# Patient Record
Sex: Male | Born: 1940
Health system: Southern US, Community
[De-identification: ages and names within clinical notes are randomized; demographics above are authoritative.]

## PROBLEM LIST (undated history)

## (undated) DIAGNOSIS — M1990 Unspecified osteoarthritis, unspecified site: Secondary | ICD-10-CM

## (undated) DIAGNOSIS — E785 Hyperlipidemia, unspecified: Secondary | ICD-10-CM

## (undated) DIAGNOSIS — Z8679 Personal history of other diseases of the circulatory system: Secondary | ICD-10-CM

## (undated) DIAGNOSIS — Z87442 Personal history of urinary calculi: Secondary | ICD-10-CM

## (undated) DIAGNOSIS — R011 Cardiac murmur, unspecified: Secondary | ICD-10-CM

## (undated) DIAGNOSIS — I10 Essential (primary) hypertension: Secondary | ICD-10-CM

## (undated) DIAGNOSIS — K5790 Diverticulosis of intestine, part unspecified, without perforation or abscess without bleeding: Secondary | ICD-10-CM

## (undated) DIAGNOSIS — H269 Unspecified cataract: Secondary | ICD-10-CM

## (undated) DIAGNOSIS — D126 Benign neoplasm of colon, unspecified: Secondary | ICD-10-CM

## (undated) DIAGNOSIS — K219 Gastro-esophageal reflux disease without esophagitis: Secondary | ICD-10-CM

## (undated) HISTORY — PX: NECK SURGERY: SHX720

## (undated) HISTORY — DX: Diverticulosis of intestine, part unspecified, without perforation or abscess without bleeding: K57.90

## (undated) HISTORY — DX: Unspecified osteoarthritis, unspecified site: M19.90

## (undated) HISTORY — DX: Essential (primary) hypertension: I10

## (undated) HISTORY — PX: OTHER SURGICAL HISTORY: SHX169

## (undated) HISTORY — DX: Gastro-esophageal reflux disease without esophagitis: K21.9

## (undated) HISTORY — DX: Benign neoplasm of colon, unspecified: D12.6

## (undated) HISTORY — PX: COLONOSCOPY: SHX174

## (undated) HISTORY — DX: Hyperlipidemia, unspecified: E78.5

## (undated) HISTORY — DX: Unspecified cataract: H26.9

## (undated) HISTORY — PX: POLYPECTOMY: SHX149

## (undated) HISTORY — PX: EYE SURGERY: SHX253

---

## 1994-07-19 HISTORY — PX: ANKLE FRACTURE SURGERY: SHX122

## 1995-07-20 HISTORY — PX: CHOLECYSTECTOMY: SHX55

## 2005-12-01 ENCOUNTER — Ambulatory Visit: Payer: Self-pay | Admitting: Gastroenterology

## 2005-12-28 ENCOUNTER — Ambulatory Visit: Payer: Self-pay | Admitting: Gastroenterology

## 2005-12-28 ENCOUNTER — Encounter (INDEPENDENT_AMBULATORY_CARE_PROVIDER_SITE_OTHER): Payer: Self-pay | Admitting: Specialist

## 2009-07-19 HISTORY — PX: CATARACT EXTRACTION, BILATERAL: SHX1313

## 2010-01-22 ENCOUNTER — Ambulatory Visit (HOSPITAL_COMMUNITY): Admission: RE | Admit: 2010-01-22 | Discharge: 2010-01-22 | Payer: Self-pay | Admitting: Ophthalmology

## 2010-02-05 ENCOUNTER — Ambulatory Visit (HOSPITAL_COMMUNITY): Admission: RE | Admit: 2010-02-05 | Discharge: 2010-02-05 | Payer: Self-pay | Admitting: Ophthalmology

## 2010-03-05 ENCOUNTER — Ambulatory Visit (HOSPITAL_COMMUNITY): Admission: RE | Admit: 2010-03-05 | Discharge: 2010-03-05 | Payer: Self-pay | Admitting: Ophthalmology

## 2010-10-04 LAB — GLUCOSE, CAPILLARY: Glucose-Capillary: 134 mg/dL — ABNORMAL HIGH (ref 70–99)

## 2010-10-05 LAB — BASIC METABOLIC PANEL
BUN: 13 mg/dL (ref 6–23)
CO2: 26 mEq/L (ref 19–32)
Chloride: 103 mEq/L (ref 96–112)
Creatinine, Ser: 0.74 mg/dL (ref 0.4–1.5)

## 2011-03-01 ENCOUNTER — Encounter: Payer: Self-pay | Admitting: Gastroenterology

## 2011-04-14 ENCOUNTER — Ambulatory Visit (AMBULATORY_SURGERY_CENTER): Payer: Medicare Other | Admitting: *Deleted

## 2011-04-14 VITALS — Ht 68.0 in | Wt 214.0 lb

## 2011-04-14 DIAGNOSIS — Z1211 Encounter for screening for malignant neoplasm of colon: Secondary | ICD-10-CM

## 2011-04-14 MED ORDER — PEG-KCL-NACL-NASULF-NA ASC-C 100 G PO SOLR: ORAL | Status: DC

## 2011-04-28 ENCOUNTER — Other Ambulatory Visit: Payer: Self-pay | Admitting: Gastroenterology

## 2011-04-28 ENCOUNTER — Encounter: Payer: Self-pay | Admitting: Gastroenterology

## 2011-04-28 ENCOUNTER — Ambulatory Visit (AMBULATORY_SURGERY_CENTER): Payer: Medicare Other | Admitting: Gastroenterology

## 2011-04-28 VITALS — BP 125/84 | HR 72 | Temp 98.3°F | Resp 16 | Ht 68.0 in | Wt 214.0 lb

## 2011-04-28 DIAGNOSIS — K5730 Diverticulosis of large intestine without perforation or abscess without bleeding: Secondary | ICD-10-CM

## 2011-04-28 DIAGNOSIS — D126 Benign neoplasm of colon, unspecified: Secondary | ICD-10-CM

## 2011-04-28 DIAGNOSIS — Z86010 Personal history of colonic polyps: Secondary | ICD-10-CM

## 2011-04-28 DIAGNOSIS — Z800 Family history of malignant neoplasm of digestive organs: Secondary | ICD-10-CM

## 2011-04-28 DIAGNOSIS — Z1211 Encounter for screening for malignant neoplasm of colon: Secondary | ICD-10-CM

## 2011-04-28 DIAGNOSIS — D1330 Benign neoplasm of unspecified part of small intestine: Secondary | ICD-10-CM

## 2011-04-28 HISTORY — DX: Benign neoplasm of colon, unspecified: D12.6

## 2011-04-28 MED ORDER — SODIUM CHLORIDE 0.9 % IV SOLN: 500.0000 mL | INTRAVENOUS | Status: DC

## 2011-04-28 NOTE — Patient Instructions (Addendum)
Polyps, Colon  A polyp is extra tissue that grows inside your body. Colon polyps grow in the large intestine. The large intestine, also called the colon, is part of your digestive system. It is a long, hollow tube at the end of your digestive tract where your body makes and stores stool. Most polyps are not dangerous. They are benign. This means they are not cancerous. But over time, some types of polyps can turn into cancer. Polyps that are smaller than a pea are usually not harmful. But larger polyps could someday become or may already be cancerous. To be safe, doctors remove all polyps and test them.  WHO GETS POLYPS? Anyone can get polyps, but certain people are more likely than others. You may have a greater chance of getting polyps if:  You are over 50.   You have had polyps before.   Someone in your family has had polyps.   Someone in your family has had cancer of the large intestine.   Find out if someone in your family has had polyps. You may also be more likely to get polyps if you:   Eat a lot of fatty foods   Smoke   Drink alcohol   Do not exercise  Eat too much  SYMPTOMS Most small polyps do not cause symptoms. People often do not know they have one until their caregiver finds it during a regular checkup or while testing them for something else. Some people do have symptoms like these:  Bleeding from the anus. You might notice blood on your underwear or on toilet paper after you have had a bowel movement.   Constipation or diarrhea that lasts more than a week.   Blood in the stool. Blood can make stool look black or it can show up as red streaks in the stool.  If you have any of these symptoms, see your caregiver. HOW DOES THE DOCTOR TEST FOR POLYPS? The doctor can use four tests to check for polyps:  Digital rectal exam. The caregiver wears gloves and checks your rectum (the last part of the large intestine) to see if it feels normal. This test would find polyps only  in the rectum. Your caregiver may need to do one of the other tests listed below to find polyps higher up in the intestine.   Barium enema. The caregiver puts a liquid called barium into your rectum before taking x-rays of your large intestine. Barium makes your intestine look white in the pictures. Polyps are dark, so they are easy to see.   Sigmoidoscopy. With this test, the caregiver can see inside your large intestine. A thin flexible tube is placed into your rectum. The device is called a sigmoidoscope, which has a light and a tiny video camera in it. The caregiver uses the sigmoidoscope to look at the last third of your large intestine.   Colonoscopy. This test is like sigmoidoscopy, but the caregiver looks at all of the large intestine. It usually requires sedation. This is the most common method for finding and removing polyps.  TREATMENT  The caregiver will remove the polyp during sigmoidoscopy or colonoscopy. The polyp is then tested for cancer.   If you have had polyps, your caregiver may want you to get tested regularly in the future.  PREVENTION There is not one sure way to prevent polyps. You might be able to lower your risk of getting them if you:  Eat more fruits and vegetables and less fatty food.     Do not smoke.   Avoid alcohol.   Exercise every day.   Lose weight if you are overweight.   Eating more calcium and folate can also lower your risk of getting polyps. Some foods that are rich in calcium are milk, cheese, and broccoli. Some foods that are rich in folate are chickpeas, kidney beans, and spinach.   Aspirin might help prevent polyps. Studies are under way.  Document Released: 03/31/2004 Document Re-Released: 12/23/2009 [facility] Patient Information 2011 [place], [place]  .Diverticulosis Diverticulosis is a common condition that develops when small pouches (diverticula) form in the wall of the colon. The risk of diverticulosis increases with age. It happens more  often in people who eat a low-fiber diet. Most individuals with diverticulosis have no symptoms. Those individuals with symptoms usually experience belly (abdominal) pain, constipation, or loose stools (diarrhea). HOME CARE INSTRUCTIONS  Increase the amount of fiber in your diet as directed by your caregiver or dietician. This may reduce symptoms of diverticulosis.   Your caregiver may recommend taking a dietary fiber supplement.   Drink at least 6 to 8 glasses of water each day to prevent constipation.   Try not to strain when you have a bowel movement.   Your caregiver may recommend avoiding nuts and seeds to prevent complications, although this is still an uncertain benefit.   Only take over-the-counter or prescription medicines for pain, discomfort, or fever as directed by your caregiver.  FOODS HAVING HIGH FIBER CONTENT INCLUDE:  Fruits. Apple, peach, pear, tangerine, raisins, prunes.   Vegetables. Brussels sprouts, asparagus, broccoli, cabbage, carrot, cauliflower, romaine lettuce, spinach, summer squash, tomato, winter squash, zucchini.   Starchy Vegetables. Baked beans, kidney beans, lima beans, split peas, lentils, potatoes (with skin).   Grains. Whole wheat bread, brown rice, bran flake cereal, plain oatmeal, white rice, shredded wheat, bran muffins.  SEEK IMMEDIATE MEDICAL CARE IF:  You develop increasing pain or severe bloating.   You have an oral temperature above 100, not controlled by medicine.   You develop vomiting or bowel movements that are bloody or black.  Document Released: 04/01/2004 Document Re-Released: 12/23/2009 [facility] Patient Information 2011 [place], [place].   Please refer to your blue and neon green sheets for instructions regarding diet and activity for the rest of today.

## 2011-04-29 ENCOUNTER — Telehealth: Payer: Self-pay | Admitting: *Deleted

## 2011-04-29 NOTE — Telephone Encounter (Signed)

## 2011-05-12 ENCOUNTER — Encounter: Payer: Self-pay | Admitting: Gastroenterology

## 2012-03-03 ENCOUNTER — Other Ambulatory Visit: Payer: Self-pay | Admitting: Neurosurgery

## 2012-03-03 DIAGNOSIS — M5126 Other intervertebral disc displacement, lumbar region: Secondary | ICD-10-CM

## 2012-03-06 ENCOUNTER — Ambulatory Visit
Admission: RE | Admit: 2012-03-06 | Discharge: 2012-03-06 | Disposition: A | Discharge status: Home / Self Care | Payer: Medicare Other | Source: Ambulatory Visit | Attending: Neurosurgery | Admitting: Neurosurgery

## 2012-03-06 VITALS — BP 140/81 | HR 62

## 2012-03-06 DIAGNOSIS — M5126 Other intervertebral disc displacement, lumbar region: Secondary | ICD-10-CM

## 2012-03-06 MED ORDER — METHYLPREDNISOLONE ACETATE 40 MG/ML INJ SUSP (RADIOLOG
120.0000 mg | Freq: Once | INTRAMUSCULAR | Status: AC
  Administered 2012-03-06: 120 mg via EPIDURAL

## 2012-03-06 MED ORDER — IOHEXOL 180 MG/ML  SOLN
1.0000 mL | Freq: Once | INTRAMUSCULAR | Status: AC | PRN
  Administered 2012-03-06: 1 mL via EPIDURAL

## 2015-06-30 ENCOUNTER — Encounter: Payer: Self-pay | Admitting: Gastroenterology

## 2015-07-28 DIAGNOSIS — M19012 Primary osteoarthritis, left shoulder: Secondary | ICD-10-CM | POA: Diagnosis not present

## 2015-09-17 DIAGNOSIS — M25511 Pain in right shoulder: Secondary | ICD-10-CM | POA: Diagnosis not present

## 2015-09-17 DIAGNOSIS — E1165 Type 2 diabetes mellitus with hyperglycemia: Secondary | ICD-10-CM | POA: Diagnosis not present

## 2015-09-17 DIAGNOSIS — E7800 Pure hypercholesterolemia, unspecified: Secondary | ICD-10-CM | POA: Diagnosis not present

## 2015-09-17 DIAGNOSIS — Z6833 Body mass index (BMI) 33.0-33.9, adult: Secondary | ICD-10-CM | POA: Diagnosis not present

## 2015-09-17 DIAGNOSIS — M1990 Unspecified osteoarthritis, unspecified site: Secondary | ICD-10-CM | POA: Diagnosis not present

## 2015-11-10 DIAGNOSIS — H6123 Impacted cerumen, bilateral: Secondary | ICD-10-CM | POA: Diagnosis not present

## 2015-12-25 DIAGNOSIS — E1165 Type 2 diabetes mellitus with hyperglycemia: Secondary | ICD-10-CM | POA: Diagnosis not present

## 2015-12-25 DIAGNOSIS — Z299 Encounter for prophylactic measures, unspecified: Secondary | ICD-10-CM | POA: Diagnosis not present

## 2016-02-27 ENCOUNTER — Encounter: Payer: Self-pay | Admitting: Gastroenterology

## 2016-03-10 ENCOUNTER — Encounter: Payer: Self-pay | Admitting: Gastroenterology

## 2016-04-02 DIAGNOSIS — Z299 Encounter for prophylactic measures, unspecified: Secondary | ICD-10-CM | POA: Diagnosis not present

## 2016-04-02 DIAGNOSIS — E1165 Type 2 diabetes mellitus with hyperglycemia: Secondary | ICD-10-CM | POA: Diagnosis not present

## 2016-04-02 DIAGNOSIS — E7800 Pure hypercholesterolemia, unspecified: Secondary | ICD-10-CM | POA: Diagnosis not present

## 2016-04-02 DIAGNOSIS — I10 Essential (primary) hypertension: Secondary | ICD-10-CM | POA: Diagnosis not present

## 2016-04-02 DIAGNOSIS — M25511 Pain in right shoulder: Secondary | ICD-10-CM | POA: Diagnosis not present

## 2016-05-03 DIAGNOSIS — Z23 Encounter for immunization: Secondary | ICD-10-CM | POA: Diagnosis not present

## 2016-05-04 ENCOUNTER — Encounter: Payer: Self-pay | Admitting: Gastroenterology

## 2016-06-23 ENCOUNTER — Ambulatory Visit (AMBULATORY_SURGERY_CENTER): Payer: Self-pay | Admitting: *Deleted

## 2016-06-23 VITALS — Ht 68.0 in | Wt 221.0 lb

## 2016-06-23 DIAGNOSIS — Z800 Family history of malignant neoplasm of digestive organs: Secondary | ICD-10-CM

## 2016-06-23 DIAGNOSIS — Z86010 Personal history of colonic polyps: Secondary | ICD-10-CM

## 2016-06-23 MED ORDER — NA SULFATE-K SULFATE-MG SULF 17.5-3.13-1.6 GM/177ML PO SOLN: 1.0000 | Freq: Once | ORAL | 0 refills | Status: AC

## 2016-06-23 NOTE — Progress Notes (Signed)
No egg or soy allergy known to patient  No issues with past sedation with any surgeries  or procedures, no intubation problems  No diet pills per patient No home 02 use per patient  No blood thinners per patient  Pt denies issues with constipation  No A fib or A flutter  emmi video declined   

## 2016-06-28 DIAGNOSIS — Z7189 Other specified counseling: Secondary | ICD-10-CM | POA: Diagnosis not present

## 2016-06-28 DIAGNOSIS — Z299 Encounter for prophylactic measures, unspecified: Secondary | ICD-10-CM | POA: Diagnosis not present

## 2016-06-28 DIAGNOSIS — Z1389 Encounter for screening for other disorder: Secondary | ICD-10-CM | POA: Diagnosis not present

## 2016-06-28 DIAGNOSIS — Z1211 Encounter for screening for malignant neoplasm of colon: Secondary | ICD-10-CM | POA: Diagnosis not present

## 2016-06-28 DIAGNOSIS — Z0000 Encounter for general adult medical examination without abnormal findings: Secondary | ICD-10-CM | POA: Diagnosis not present

## 2016-06-28 DIAGNOSIS — E7800 Pure hypercholesterolemia, unspecified: Secondary | ICD-10-CM | POA: Diagnosis not present

## 2016-06-28 DIAGNOSIS — Z79899 Other long term (current) drug therapy: Secondary | ICD-10-CM | POA: Diagnosis not present

## 2016-06-28 DIAGNOSIS — R5383 Other fatigue: Secondary | ICD-10-CM | POA: Diagnosis not present

## 2016-06-28 DIAGNOSIS — Z125 Encounter for screening for malignant neoplasm of prostate: Secondary | ICD-10-CM | POA: Diagnosis not present

## 2016-06-28 DIAGNOSIS — I10 Essential (primary) hypertension: Secondary | ICD-10-CM | POA: Diagnosis not present

## 2016-07-05 ENCOUNTER — Encounter: Payer: Self-pay | Admitting: Gastroenterology

## 2016-07-05 ENCOUNTER — Ambulatory Visit (AMBULATORY_SURGERY_CENTER): Payer: Medicare Other | Admitting: Gastroenterology

## 2016-07-05 VITALS — BP 98/74 | HR 63 | Temp 98.0°F | Resp 13 | Ht 68.0 in | Wt 221.0 lb

## 2016-07-05 DIAGNOSIS — Z800 Family history of malignant neoplasm of digestive organs: Secondary | ICD-10-CM

## 2016-07-05 DIAGNOSIS — Z86010 Personal history of colonic polyps: Secondary | ICD-10-CM | POA: Diagnosis not present

## 2016-07-05 DIAGNOSIS — I10 Essential (primary) hypertension: Secondary | ICD-10-CM | POA: Diagnosis not present

## 2016-07-05 DIAGNOSIS — E119 Type 2 diabetes mellitus without complications: Secondary | ICD-10-CM | POA: Diagnosis not present

## 2016-07-05 MED ORDER — SODIUM CHLORIDE 0.9 % IV SOLN: 500.0000 mL | INTRAVENOUS | Status: DC

## 2016-07-05 NOTE — Op Note (Signed)
[place] Patient Name: Julian Dominguez Procedure Date: 07/05/2016 8:07 AM MRN: [id number] Endoscopist: Mallie Mussel L. Loletha Carrow , MD Age: 75 Referring MD:  Date of Birth: 1940-10-22 Gender: Male Account #: [id number] Procedure:                Colonoscopy Indications:              Surveillance: Personal history of adenomatous                            polyps on last colonoscopy 5 years ago (46mm TA                            10/12) Medicines:                Monitored Anesthesia Care Procedure:                Pre-Anesthesia Assessment:                           - Prior to the procedure, a History and Physical                            was performed, and patient medications and                            allergies were reviewed. The patient's tolerance of                            previous anesthesia was also reviewed. The risks                            and benefits of the procedure and the sedation                            options and risks were discussed with the patient.                            All questions were answered, and informed consent                            was obtained. Anticoagulants: The patient has taken                            aspirin. It was decided not to withhold this                            medication prior to the procedure. ASA Grade                            Assessment: II - A patient with mild systemic                            disease. After reviewing the risks and benefits,  the patient was deemed in satisfactory condition to                            undergo the procedure.                           After obtaining informed consent, the colonoscope                            was passed under direct vision. Throughout the                            procedure, the patient's blood pressure, pulse, and                            oxygen saturations were monitored continuously. The                            Model  CF-HQ190L [id number]) scope was introduced                            through the anus and advanced to the the cecum,                            identified by appendiceal orifice and ileocecal                            valve. The colonoscopy was performed without                            difficulty. The patient tolerated the procedure                            well. The quality of the bowel preparation was                            good. The ileocecal valve, appendiceal orifice, and                            rectum were photographed. The quality of the bowel                            preparation was evaluated using the BBPS [facility]                            Bowel Preparation Scale) with scores of: Right                            Colon = 2, Transverse Colon = 2 and Left Colon = 2.                            The total BBPS score equals 6. The bowel  preparation used was SUPREP. Scope In: 8:11:23 AM Scope Out: 8:28:25 AM Scope Withdrawal Time: 0 hours 8 minutes 48 seconds  Total Procedure Duration: 0 hours 17 minutes 2 seconds  Findings:                 The perianal and digital rectal examinations were                            normal.                           Multiple medium-mouthed diverticula were found in                            the left colon.                           The left colon was redundant.                           Internal hemorrhoids were found during                            retroflexion. The hemorrhoids were medium-sized and                            Grade I (internal hemorrhoids that do not prolapse).                           The exam was otherwise without abnormality on                            direct and retroflexion views. Complications:            No immediate complications. Estimated Blood Loss:     Estimated blood loss: none. Impression:               - Diverticulosis in the left colon.                           -  Internal hemorrhoids.                           - The examination was otherwise normal on direct                            and retroflexion views.                           - No specimens collected. Recommendation:           - Patient has a contact number available for                            emergencies. The signs and symptoms of potential                            delayed complications were discussed with the  patient. Return to normal activities tomorrow.                            Written discharge instructions were provided to the                            patient.                           - Resume previous diet.                           - Continue present medications.                           - Repeat colonoscopy in 5 years for screening                            purposes due to [place], AGE 17. Miette Molenda L. Loletha Carrow, MD 07/05/2016 8:32:30 AM This report has been signed electronically.

## 2016-07-05 NOTE — Progress Notes (Signed)
A/ox3, pleased with MAC, report to RN 

## 2016-07-05 NOTE — Patient Instructions (Signed)
  Handouts given: Diverticulosis and Hemorrhoids.   YOU HAD AN ENDOSCOPIC PROCEDURE TODAY AT THE [facility]:   Refer to the procedure report that was given to you for any specific questions about what was found during the examination.  If the procedure report does not answer your questions, please call your gastroenterologist to clarify.  If you requested that your care partner not be given the details of your procedure findings, then the procedure report has been included in a sealed envelope for you to review at your convenience later.  YOU SHOULD EXPECT: Some feelings of bloating in the abdomen. Passage of more gas than usual.  Walking can help get rid of the air that was put into your GI tract during the procedure and reduce the bloating. If you had a lower endoscopy (such as a colonoscopy or flexible sigmoidoscopy) you may notice spotting of blood in your stool or on the toilet paper. If you underwent a bowel prep for your procedure, you may not have a normal bowel movement for a few days.  Please Note:  You might notice some irritation and congestion in your nose or some drainage.  This is from the oxygen used during your procedure.  There is no need for concern and it should clear up in a day or so.  SYMPTOMS TO REPORT IMMEDIATELY:   Following lower endoscopy (colonoscopy or flexible sigmoidoscopy):  Excessive amounts of blood in the stool  Significant tenderness or worsening of abdominal pains  Swelling of the abdomen that is new, acute  Fever of 100F or higher    For urgent or emergent issues, a gastroenterologist can be reached at any hour by calling [phone].   DIET:  We do recommend a small meal at first, but then you may proceed to your regular diet.  Drink plenty of fluids but you should avoid alcoholic beverages for 24 hours.  ACTIVITY:  You should plan to take it easy for the rest of today and you should NOT DRIVE or use heavy machinery until tomorrow  (because of the sedation medicines used during the test).    FOLLOW UP: Our staff will call the number listed on your records the next business day following your procedure to check on you and address any questions or concerns that you may have regarding the information given to you following your procedure. If we do not reach you, we will leave a message.  However, if you are feeling well and you are not experiencing any problems, there is no need to return our call.  We will assume that you have returned to your regular daily activities without incident.  If any biopsies were taken you will be contacted by phone or by letter within the next 1-3 weeks.  Please call us at [phone] if you have not heard about the biopsies in 3 weeks.    SIGNATURES/CONFIDENTIALITY: You and/or your care partner have signed paperwork which will be entered into your electronic medical record.  These signatures attest to the fact that that the information above on your After Visit Summary has been reviewed and is understood.  Full responsibility of the confidentiality of this discharge information lies with you and/or your care-partner. 

## 2016-07-05 NOTE — Progress Notes (Signed)
Patient sitting on the side of the bed, urinated 400 ml.

## 2016-07-06 ENCOUNTER — Telehealth: Payer: Self-pay

## 2016-07-06 NOTE — Telephone Encounter (Signed)
  Follow up Call-  Call back number 07/05/2016  Post procedure Call Back phone  # [phone]  Permission to leave phone message Yes  Some recent data might be hidden     Patient questions:  Do you have a fever, pain , or abdominal swelling? No. Pain Score  0 *  Have you tolerated food without any problems? Yes.    Have you been able to return to your normal activities? Yes.    Do you have any questions about your discharge instructions: Diet   No. Medications  No. Follow up visit  No.  Do you have questions or concerns about your Care? No.  Actions: * If pain score is 4 or above: No action needed, pain <4.   Pt was asleep. I spoke with his wife.  No problems noted. maw

## 2016-07-07 DIAGNOSIS — M159 Polyosteoarthritis, unspecified: Secondary | ICD-10-CM | POA: Diagnosis not present

## 2016-07-07 DIAGNOSIS — E119 Type 2 diabetes mellitus without complications: Secondary | ICD-10-CM | POA: Diagnosis not present

## 2016-07-07 DIAGNOSIS — E7800 Pure hypercholesterolemia, unspecified: Secondary | ICD-10-CM | POA: Diagnosis not present

## 2016-07-09 DIAGNOSIS — Z299 Encounter for prophylactic measures, unspecified: Secondary | ICD-10-CM | POA: Diagnosis not present

## 2016-07-09 DIAGNOSIS — E1165 Type 2 diabetes mellitus with hyperglycemia: Secondary | ICD-10-CM | POA: Diagnosis not present

## 2016-10-21 DIAGNOSIS — E1165 Type 2 diabetes mellitus with hyperglycemia: Secondary | ICD-10-CM | POA: Diagnosis not present

## 2016-10-21 DIAGNOSIS — Z6833 Body mass index (BMI) 33.0-33.9, adult: Secondary | ICD-10-CM | POA: Diagnosis not present

## 2016-10-21 DIAGNOSIS — N400 Enlarged prostate without lower urinary tract symptoms: Secondary | ICD-10-CM | POA: Diagnosis not present

## 2016-10-21 DIAGNOSIS — Z299 Encounter for prophylactic measures, unspecified: Secondary | ICD-10-CM | POA: Diagnosis not present

## 2016-10-21 DIAGNOSIS — I10 Essential (primary) hypertension: Secondary | ICD-10-CM | POA: Diagnosis not present

## 2016-10-21 DIAGNOSIS — E7800 Pure hypercholesterolemia, unspecified: Secondary | ICD-10-CM | POA: Diagnosis not present

## 2017-01-27 DIAGNOSIS — E1165 Type 2 diabetes mellitus with hyperglycemia: Secondary | ICD-10-CM | POA: Diagnosis not present

## 2017-01-27 DIAGNOSIS — Z6832 Body mass index (BMI) 32.0-32.9, adult: Secondary | ICD-10-CM | POA: Diagnosis not present

## 2017-01-27 DIAGNOSIS — M1990 Unspecified osteoarthritis, unspecified site: Secondary | ICD-10-CM | POA: Diagnosis not present

## 2017-01-27 DIAGNOSIS — Z299 Encounter for prophylactic measures, unspecified: Secondary | ICD-10-CM | POA: Diagnosis not present

## 2017-01-27 DIAGNOSIS — N400 Enlarged prostate without lower urinary tract symptoms: Secondary | ICD-10-CM | POA: Diagnosis not present

## 2017-01-27 DIAGNOSIS — I10 Essential (primary) hypertension: Secondary | ICD-10-CM | POA: Diagnosis not present

## 2017-04-26 DIAGNOSIS — Z23 Encounter for immunization: Secondary | ICD-10-CM | POA: Diagnosis not present

## 2017-05-06 DIAGNOSIS — Z6832 Body mass index (BMI) 32.0-32.9, adult: Secondary | ICD-10-CM | POA: Diagnosis not present

## 2017-05-06 DIAGNOSIS — Z299 Encounter for prophylactic measures, unspecified: Secondary | ICD-10-CM | POA: Diagnosis not present

## 2017-05-06 DIAGNOSIS — Z713 Dietary counseling and surveillance: Secondary | ICD-10-CM | POA: Diagnosis not present

## 2017-05-06 DIAGNOSIS — E1165 Type 2 diabetes mellitus with hyperglycemia: Secondary | ICD-10-CM | POA: Diagnosis not present

## 2017-05-06 DIAGNOSIS — I10 Essential (primary) hypertension: Secondary | ICD-10-CM | POA: Diagnosis not present

## 2017-06-23 DIAGNOSIS — E119 Type 2 diabetes mellitus without complications: Secondary | ICD-10-CM | POA: Diagnosis not present

## 2017-07-01 DIAGNOSIS — Z6832 Body mass index (BMI) 32.0-32.9, adult: Secondary | ICD-10-CM | POA: Diagnosis not present

## 2017-07-01 DIAGNOSIS — Z1331 Encounter for screening for depression: Secondary | ICD-10-CM | POA: Diagnosis not present

## 2017-07-01 DIAGNOSIS — Z7189 Other specified counseling: Secondary | ICD-10-CM | POA: Diagnosis not present

## 2017-07-01 DIAGNOSIS — R5383 Other fatigue: Secondary | ICD-10-CM | POA: Diagnosis not present

## 2017-07-01 DIAGNOSIS — Z0000 Encounter for general adult medical examination without abnormal findings: Secondary | ICD-10-CM | POA: Diagnosis not present

## 2017-07-01 DIAGNOSIS — I10 Essential (primary) hypertension: Secondary | ICD-10-CM | POA: Diagnosis not present

## 2017-07-01 DIAGNOSIS — Z87891 Personal history of nicotine dependence: Secondary | ICD-10-CM | POA: Diagnosis not present

## 2017-07-01 DIAGNOSIS — E7800 Pure hypercholesterolemia, unspecified: Secondary | ICD-10-CM | POA: Diagnosis not present

## 2017-07-01 DIAGNOSIS — Z125 Encounter for screening for malignant neoplasm of prostate: Secondary | ICD-10-CM | POA: Diagnosis not present

## 2017-07-01 DIAGNOSIS — Z79899 Other long term (current) drug therapy: Secondary | ICD-10-CM | POA: Diagnosis not present

## 2017-07-01 DIAGNOSIS — Z1339 Encounter for screening examination for other mental health and behavioral disorders: Secondary | ICD-10-CM | POA: Diagnosis not present

## 2017-07-01 DIAGNOSIS — Z299 Encounter for prophylactic measures, unspecified: Secondary | ICD-10-CM | POA: Diagnosis not present

## 2017-08-19 DIAGNOSIS — Z6832 Body mass index (BMI) 32.0-32.9, adult: Secondary | ICD-10-CM | POA: Diagnosis not present

## 2017-08-19 DIAGNOSIS — I10 Essential (primary) hypertension: Secondary | ICD-10-CM | POA: Diagnosis not present

## 2017-08-19 DIAGNOSIS — E1165 Type 2 diabetes mellitus with hyperglycemia: Secondary | ICD-10-CM | POA: Diagnosis not present

## 2017-08-19 DIAGNOSIS — Z713 Dietary counseling and surveillance: Secondary | ICD-10-CM | POA: Diagnosis not present

## 2017-08-19 DIAGNOSIS — Z299 Encounter for prophylactic measures, unspecified: Secondary | ICD-10-CM | POA: Diagnosis not present

## 2017-11-10 DIAGNOSIS — M47816 Spondylosis without myelopathy or radiculopathy, lumbar region: Secondary | ICD-10-CM | POA: Diagnosis not present

## 2017-11-10 DIAGNOSIS — M5442 Lumbago with sciatica, left side: Secondary | ICD-10-CM | POA: Diagnosis not present

## 2017-11-10 DIAGNOSIS — M9903 Segmental and somatic dysfunction of lumbar region: Secondary | ICD-10-CM | POA: Diagnosis not present

## 2017-11-11 DIAGNOSIS — M9903 Segmental and somatic dysfunction of lumbar region: Secondary | ICD-10-CM | POA: Diagnosis not present

## 2017-11-11 DIAGNOSIS — M47816 Spondylosis without myelopathy or radiculopathy, lumbar region: Secondary | ICD-10-CM | POA: Diagnosis not present

## 2017-11-11 DIAGNOSIS — M5442 Lumbago with sciatica, left side: Secondary | ICD-10-CM | POA: Diagnosis not present

## 2017-11-15 DIAGNOSIS — D1800 Hemangioma unspecified site: Secondary | ICD-10-CM | POA: Diagnosis not present

## 2017-11-15 DIAGNOSIS — M47816 Spondylosis without myelopathy or radiculopathy, lumbar region: Secondary | ICD-10-CM | POA: Diagnosis not present

## 2017-11-15 DIAGNOSIS — M9903 Segmental and somatic dysfunction of lumbar region: Secondary | ICD-10-CM | POA: Diagnosis not present

## 2017-11-15 DIAGNOSIS — L570 Actinic keratosis: Secondary | ICD-10-CM | POA: Diagnosis not present

## 2017-11-15 DIAGNOSIS — M5442 Lumbago with sciatica, left side: Secondary | ICD-10-CM | POA: Diagnosis not present

## 2017-11-15 DIAGNOSIS — D485 Neoplasm of uncertain behavior of skin: Secondary | ICD-10-CM | POA: Diagnosis not present

## 2017-11-15 DIAGNOSIS — L821 Other seborrheic keratosis: Secondary | ICD-10-CM | POA: Diagnosis not present

## 2017-11-18 DIAGNOSIS — M9903 Segmental and somatic dysfunction of lumbar region: Secondary | ICD-10-CM | POA: Diagnosis not present

## 2017-11-18 DIAGNOSIS — M5442 Lumbago with sciatica, left side: Secondary | ICD-10-CM | POA: Diagnosis not present

## 2017-11-18 DIAGNOSIS — M47816 Spondylosis without myelopathy or radiculopathy, lumbar region: Secondary | ICD-10-CM | POA: Diagnosis not present

## 2017-12-02 DIAGNOSIS — Z299 Encounter for prophylactic measures, unspecified: Secondary | ICD-10-CM | POA: Diagnosis not present

## 2017-12-02 DIAGNOSIS — E7800 Pure hypercholesterolemia, unspecified: Secondary | ICD-10-CM | POA: Diagnosis not present

## 2017-12-02 DIAGNOSIS — M545 Low back pain: Secondary | ICD-10-CM | POA: Diagnosis not present

## 2017-12-02 DIAGNOSIS — E1165 Type 2 diabetes mellitus with hyperglycemia: Secondary | ICD-10-CM | POA: Diagnosis not present

## 2017-12-02 DIAGNOSIS — I10 Essential (primary) hypertension: Secondary | ICD-10-CM | POA: Diagnosis not present

## 2017-12-02 DIAGNOSIS — Z6832 Body mass index (BMI) 32.0-32.9, adult: Secondary | ICD-10-CM | POA: Diagnosis not present

## 2018-03-13 DIAGNOSIS — I10 Essential (primary) hypertension: Secondary | ICD-10-CM | POA: Diagnosis not present

## 2018-03-13 DIAGNOSIS — Z6830 Body mass index (BMI) 30.0-30.9, adult: Secondary | ICD-10-CM | POA: Diagnosis not present

## 2018-03-13 DIAGNOSIS — E1165 Type 2 diabetes mellitus with hyperglycemia: Secondary | ICD-10-CM | POA: Diagnosis not present

## 2018-03-13 DIAGNOSIS — Z299 Encounter for prophylactic measures, unspecified: Secondary | ICD-10-CM | POA: Diagnosis not present

## 2018-03-13 DIAGNOSIS — B029 Zoster without complications: Secondary | ICD-10-CM | POA: Diagnosis not present

## 2018-04-25 DIAGNOSIS — Z23 Encounter for immunization: Secondary | ICD-10-CM | POA: Diagnosis not present

## 2018-06-16 DIAGNOSIS — E1165 Type 2 diabetes mellitus with hyperglycemia: Secondary | ICD-10-CM | POA: Diagnosis not present

## 2018-06-16 DIAGNOSIS — Z299 Encounter for prophylactic measures, unspecified: Secondary | ICD-10-CM | POA: Diagnosis not present

## 2018-06-16 DIAGNOSIS — I10 Essential (primary) hypertension: Secondary | ICD-10-CM | POA: Diagnosis not present

## 2018-06-16 DIAGNOSIS — Z6830 Body mass index (BMI) 30.0-30.9, adult: Secondary | ICD-10-CM | POA: Diagnosis not present

## 2018-07-06 DIAGNOSIS — I10 Essential (primary) hypertension: Secondary | ICD-10-CM | POA: Diagnosis not present

## 2018-07-06 DIAGNOSIS — Z125 Encounter for screening for malignant neoplasm of prostate: Secondary | ICD-10-CM | POA: Diagnosis not present

## 2018-07-06 DIAGNOSIS — R5383 Other fatigue: Secondary | ICD-10-CM | POA: Diagnosis not present

## 2018-07-06 DIAGNOSIS — Z299 Encounter for prophylactic measures, unspecified: Secondary | ICD-10-CM | POA: Diagnosis not present

## 2018-07-06 DIAGNOSIS — Z6830 Body mass index (BMI) 30.0-30.9, adult: Secondary | ICD-10-CM | POA: Diagnosis not present

## 2018-07-06 DIAGNOSIS — Z79899 Other long term (current) drug therapy: Secondary | ICD-10-CM | POA: Diagnosis not present

## 2018-07-06 DIAGNOSIS — E1165 Type 2 diabetes mellitus with hyperglycemia: Secondary | ICD-10-CM | POA: Diagnosis not present

## 2018-07-06 DIAGNOSIS — Z1211 Encounter for screening for malignant neoplasm of colon: Secondary | ICD-10-CM | POA: Diagnosis not present

## 2018-07-06 DIAGNOSIS — Z1331 Encounter for screening for depression: Secondary | ICD-10-CM | POA: Diagnosis not present

## 2018-07-06 DIAGNOSIS — Z0000 Encounter for general adult medical examination without abnormal findings: Secondary | ICD-10-CM | POA: Diagnosis not present

## 2018-07-06 DIAGNOSIS — Z1339 Encounter for screening examination for other mental health and behavioral disorders: Secondary | ICD-10-CM | POA: Diagnosis not present

## 2018-07-06 DIAGNOSIS — Z87891 Personal history of nicotine dependence: Secondary | ICD-10-CM | POA: Diagnosis not present

## 2018-07-06 DIAGNOSIS — Z7189 Other specified counseling: Secondary | ICD-10-CM | POA: Diagnosis not present

## 2018-07-06 DIAGNOSIS — E7800 Pure hypercholesterolemia, unspecified: Secondary | ICD-10-CM | POA: Diagnosis not present

## 2018-09-11 DIAGNOSIS — E7800 Pure hypercholesterolemia, unspecified: Secondary | ICD-10-CM | POA: Diagnosis not present

## 2018-09-11 DIAGNOSIS — Z299 Encounter for prophylactic measures, unspecified: Secondary | ICD-10-CM | POA: Diagnosis not present

## 2018-09-11 DIAGNOSIS — Z6830 Body mass index (BMI) 30.0-30.9, adult: Secondary | ICD-10-CM | POA: Diagnosis not present

## 2018-09-11 DIAGNOSIS — E1165 Type 2 diabetes mellitus with hyperglycemia: Secondary | ICD-10-CM | POA: Diagnosis not present

## 2018-09-11 DIAGNOSIS — I10 Essential (primary) hypertension: Secondary | ICD-10-CM | POA: Diagnosis not present

## 2018-09-11 DIAGNOSIS — M47816 Spondylosis without myelopathy or radiculopathy, lumbar region: Secondary | ICD-10-CM | POA: Diagnosis not present

## 2018-09-18 ENCOUNTER — Other Ambulatory Visit: Payer: Self-pay | Admitting: Internal Medicine

## 2018-09-18 DIAGNOSIS — M545 Low back pain, unspecified: Secondary | ICD-10-CM

## 2018-09-23 ENCOUNTER — Ambulatory Visit
Admission: RE | Admit: 2018-09-23 | Discharge: 2018-09-23 | Disposition: A | Discharge status: Home / Self Care | Payer: Medicare Other | Source: Ambulatory Visit | Attending: Internal Medicine | Admitting: Internal Medicine

## 2018-09-23 DIAGNOSIS — M5126 Other intervertebral disc displacement, lumbar region: Secondary | ICD-10-CM | POA: Diagnosis not present

## 2018-09-23 DIAGNOSIS — M545 Low back pain, unspecified: Secondary | ICD-10-CM

## 2018-09-23 DIAGNOSIS — M47816 Spondylosis without myelopathy or radiculopathy, lumbar region: Secondary | ICD-10-CM | POA: Diagnosis not present

## 2018-09-23 DIAGNOSIS — M48061 Spinal stenosis, lumbar region without neurogenic claudication: Secondary | ICD-10-CM | POA: Diagnosis not present

## 2018-11-03 DIAGNOSIS — M48062 Spinal stenosis, lumbar region with neurogenic claudication: Secondary | ICD-10-CM | POA: Diagnosis not present

## 2018-11-03 DIAGNOSIS — M4726 Other spondylosis with radiculopathy, lumbar region: Secondary | ICD-10-CM | POA: Diagnosis not present

## 2018-11-21 DIAGNOSIS — M5116 Intervertebral disc disorders with radiculopathy, lumbar region: Secondary | ICD-10-CM | POA: Diagnosis not present

## 2018-11-21 DIAGNOSIS — M5416 Radiculopathy, lumbar region: Secondary | ICD-10-CM | POA: Diagnosis not present

## 2019-01-05 DIAGNOSIS — M48062 Spinal stenosis, lumbar region with neurogenic claudication: Secondary | ICD-10-CM | POA: Diagnosis not present

## 2019-01-11 ENCOUNTER — Other Ambulatory Visit: Payer: Self-pay | Admitting: Neurological Surgery

## 2019-01-11 DIAGNOSIS — Z299 Encounter for prophylactic measures, unspecified: Secondary | ICD-10-CM | POA: Diagnosis not present

## 2019-01-11 DIAGNOSIS — Z01818 Encounter for other preprocedural examination: Secondary | ICD-10-CM | POA: Diagnosis not present

## 2019-01-11 DIAGNOSIS — E1165 Type 2 diabetes mellitus with hyperglycemia: Secondary | ICD-10-CM | POA: Diagnosis not present

## 2019-01-11 DIAGNOSIS — I10 Essential (primary) hypertension: Secondary | ICD-10-CM | POA: Diagnosis not present

## 2019-01-11 DIAGNOSIS — Z6830 Body mass index (BMI) 30.0-30.9, adult: Secondary | ICD-10-CM | POA: Diagnosis not present

## 2019-01-15 ENCOUNTER — Other Ambulatory Visit: Payer: Self-pay | Admitting: Neurological Surgery

## 2019-01-15 ENCOUNTER — Other Ambulatory Visit (HOSPITAL_COMMUNITY): Payer: Self-pay | Admitting: Neurological Surgery

## 2019-01-15 DIAGNOSIS — M48062 Spinal stenosis, lumbar region with neurogenic claudication: Secondary | ICD-10-CM

## 2019-02-02 NOTE — Pre-Procedure Instructions (Signed)
[facility] [address] [place]  [address] Phone: [phone] Fax: [phone]      Your procedure is scheduled on Tuesday July 28th.  Report to [facility] Main Entrance "A" at 5:30 A.M., and check in at the Admitting office.  Call this number if you have problems the morning of surgery:  [phone]  Call [phone] if you have any questions prior to your surgery date Monday-Friday 8am-4pm    Remember:  Do not eat or drink after midnight the night before your surgery     Take these medicines the morning of surgery with A SIP OF WATER  traMADol (ULTRAM)    HOW TO MANAGE [place]  Why is it important to control my blood sugar before and after surgery? . Improving blood sugar levels before and after surgery helps healing and can limit problems. . A way of improving blood sugar control is eating a healthy diet by: o  Eating less sugar and carbohydrates o  Increasing activity/exercise o  Talking with your doctor about reaching your blood sugar goals . High blood sugars (greater than 180 mg/dL) can raise your risk of infections and slow your recovery, so you will need to focus on controlling your diabetes during the weeks before surgery. . Make sure that the doctor who takes care of your diabetes knows about your planned surgery including the date and location.  How do I manage my blood sugar before surgery? . Check your blood sugar at least 4 times a day, starting 2 days before surgery, to make sure that the level is not too high or low. o Check your blood sugar the morning of your surgery when you wake up and every 2 hours until you get to the Short Stay unit. . If your blood sugar is less than 70 mg/dL, you will need to treat for low blood sugar: o Do not take insulin. o Treat a low blood sugar (less than 70 mg/dL) with  cup of clear juice (cranberry or apple), 4 glucose tablets, OR glucose  gel. Recheck blood sugar in 15 minutes after treatment (to make sure it is greater than 70 mg/dL). If your blood sugar is not greater than 70 mg/dL on recheck, call [phone] o  for further instructions. . Report your blood sugar to the short stay nurse when you get to Short Stay.  . If you are admitted to the hospital after surgery: o Your blood sugar will be checked by the staff and you will probably be given insulin after surgery (instead of oral diabetes medicines) to make sure you have good blood sugar levels. o The goal for blood sugar control after surgery is 80-180 mg/dL.     WHAT DO I DO ABOUT MY DIABETES MEDICATION?   Marland Kitchen Do not take oral diabetes medicines (pills): glimepiride (AMARYL) the morning of surgery.   7 days prior to surgery STOP taking any Aspirin (unless otherwise instructed by your surgeon), Aleve, Naproxen, Ibuprofen, Motrin, Advil, Goody's, BC's, all herbal medications, fish oil, and all vitamins.    The Morning of Surgery  Do not wear jewelry, make-up or nail polish.  Do not wear lotions, powders, or perfumes/colognes, or deodorant  Do not shave 48 hours prior to surgery.  Men may shave face and neck.  Do not bring valuables to the hospital.  [facility] is not responsible for any belongings or valuables.  If you are a  smoker, DO NOT Smoke 24 hours prior to surgery IF you wear a CPAP at night please bring your mask, tubing, and machine the morning of surgery   Remember that you must have someone to transport you home after your surgery, and remain with you for 24 hours if you are discharged the same day.   Contacts, glasses, hearing aids, dentures or bridgework may not be worn into surgery.    Leave your suitcase in the car.  After surgery it may be brought to your room.  For patients admitted to the hospital, discharge time will be determined by your treatment team.  Patients discharged the day of surgery will not be allowed to drive home.     Special instructions:   [place]- Preparing For Surgery  Before surgery, you can play an important role. Because skin is not sterile, your skin needs to be as free of germs as possible. You can reduce the number of germs on your skin by washing with CHG (chlorahexidine gluconate) Soap before surgery.  CHG is an antiseptic cleaner which kills germs and bonds with the skin to continue killing germs even after washing.    Oral Hygiene is also important to reduce your risk of infection.  Remember - BRUSH YOUR TEETH THE MORNING OF SURGERY WITH YOUR REGULAR TOOTHPASTE  Please do not use if you have an allergy to CHG or antibacterial soaps. If your skin becomes reddened/irritated stop using the CHG.  Do not shave (including legs and underarms) for at least 48 hours prior to first CHG shower. It is OK to shave your face.  Please follow these instructions carefully.   1. Shower the NIGHT BEFORE SURGERY and the MORNING OF SURGERY with CHG Soap.   2. If you chose to wash your hair, wash your hair first as usual with your normal shampoo.  3. After you shampoo, rinse your hair and body thoroughly to remove the shampoo.  4. Use CHG as you would any other liquid soap. You can apply CHG directly to the skin and wash gently with a scrungie or a clean washcloth.   5. Apply the CHG Soap to your body ONLY FROM THE NECK DOWN.  Do not use on open wounds or open sores. Avoid contact with your eyes, ears, mouth and genitals (private parts). Wash Face and genitals (private parts)  with your normal soap.   6. Wash thoroughly, paying special attention to the area where your surgery will be performed.  7. Thoroughly rinse your body with warm water from the neck down.  8. DO NOT shower/wash with your normal soap after using and rinsing off the CHG Soap.  9. Pat yourself dry with a CLEAN TOWEL.  10. Wear CLEAN PAJAMAS to bed the night before surgery, wear comfortable clothes the morning of  surgery  11. Place CLEAN SHEETS on your bed the night of your first shower and DO NOT SLEEP WITH PETS.    Day of Surgery:  Do not apply any deodorants/lotions. Please shower the morning of surgery with the CHG soap  Please wear clean clothes to the hospital/surgery center.   Remember to brush your teeth WITH YOUR REGULAR TOOTHPASTE.   Please read over the following fact sheets that you were given.

## 2019-02-05 ENCOUNTER — Encounter (HOSPITAL_COMMUNITY): Payer: Self-pay

## 2019-02-05 ENCOUNTER — Encounter (HOSPITAL_COMMUNITY)
Admission: RE | Admit: 2019-02-05 | Discharge: 2019-02-05 | Disposition: A | Discharge status: Home / Self Care | Payer: Medicare Other | Source: Ambulatory Visit | Attending: Neurological Surgery | Admitting: Neurological Surgery

## 2019-02-05 ENCOUNTER — Ambulatory Visit (HOSPITAL_COMMUNITY)
Admission: RE | Admit: 2019-02-05 | Discharge: 2019-02-05 | Disposition: A | Discharge status: Home / Self Care | Payer: Medicare Other | Source: Ambulatory Visit | Attending: Neurological Surgery | Admitting: Neurological Surgery

## 2019-02-05 ENCOUNTER — Other Ambulatory Visit: Payer: Self-pay

## 2019-02-05 DIAGNOSIS — Z7984 Long term (current) use of oral hypoglycemic drugs: Secondary | ICD-10-CM | POA: Diagnosis not present

## 2019-02-05 DIAGNOSIS — Z87891 Personal history of nicotine dependence: Secondary | ICD-10-CM | POA: Insufficient documentation

## 2019-02-05 DIAGNOSIS — M48061 Spinal stenosis, lumbar region without neurogenic claudication: Secondary | ICD-10-CM | POA: Diagnosis not present

## 2019-02-05 DIAGNOSIS — E785 Hyperlipidemia, unspecified: Secondary | ICD-10-CM | POA: Diagnosis not present

## 2019-02-05 DIAGNOSIS — Z79899 Other long term (current) drug therapy: Secondary | ICD-10-CM | POA: Diagnosis not present

## 2019-02-05 DIAGNOSIS — E119 Type 2 diabetes mellitus without complications: Secondary | ICD-10-CM | POA: Diagnosis not present

## 2019-02-05 DIAGNOSIS — M48062 Spinal stenosis, lumbar region with neurogenic claudication: Secondary | ICD-10-CM | POA: Diagnosis not present

## 2019-02-05 DIAGNOSIS — I10 Essential (primary) hypertension: Secondary | ICD-10-CM | POA: Diagnosis not present

## 2019-02-05 DIAGNOSIS — Z7982 Long term (current) use of aspirin: Secondary | ICD-10-CM | POA: Diagnosis not present

## 2019-02-05 HISTORY — DX: Personal history of urinary calculi: Z87.442

## 2019-02-05 HISTORY — DX: Cardiac murmur, unspecified: R01.1

## 2019-02-05 LAB — BASIC METABOLIC PANEL
Anion gap: 9 (ref 5–15)
BUN: 25 mg/dL — ABNORMAL HIGH (ref 8–23)
CO2: 23 mmol/L (ref 22–32)
Calcium: 10.2 mg/dL (ref 8.9–10.3)
Chloride: 107 mmol/L (ref 98–111)
Creatinine, Ser: 1.15 mg/dL (ref 0.61–1.24)
GFR calc Af Amer: >60 mL/min (ref >60)
GFR calc non Af Amer: >60 mL/min (ref >60)
Glucose, Bld: 108 mg/dL — ABNORMAL HIGH (ref 70–99)
Potassium: 4.2 mmol/L (ref 3.5–5.1)
Sodium: 139 mmol/L (ref 135–145)

## 2019-02-05 LAB — TYPE AND SCREEN
ABO/RH(D): O POS
Antibody Screen: NEGATIVE

## 2019-02-05 LAB — CBC
HCT: 39.4 % (ref 39.0–52.0)
Hemoglobin: 13.2 g/dL (ref 13.0–17.0)
MCH: 31.7 pg (ref 26.0–34.0)
MCHC: 33.5 g/dL (ref 30.0–36.0)
MCV: 94.5 fL (ref 80.0–100.0)
Platelets: 199 10*3/uL (ref 150–400)
RBC: 4.17 MIL/uL — ABNORMAL LOW (ref 4.22–5.81)
RDW: 12.7 % (ref 11.5–15.5)
WBC: 6.3 10*3/uL (ref 4.0–10.5)
nRBC: 0 % (ref 0.0–0.2)

## 2019-02-05 LAB — ABO/RH: ABO/RH(D): O POS

## 2019-02-05 LAB — SURGICAL PCR SCREEN
MRSA, PCR: NEGATIVE
Staphylococcus aureus: POSITIVE — AB

## 2019-02-05 LAB — GLUCOSE, CAPILLARY: Glucose-Capillary: 104 mg/dL — ABNORMAL HIGH (ref 70–99)

## 2019-02-05 NOTE — Progress Notes (Signed)
Mupirocin Ointment Rx called into [facility] in [place] for positive PCR of Staph. Pt notifed of result and need to pick up Rx. He voiced understanding.

## 2019-02-05 NOTE — Progress Notes (Signed)
PCP - Dr. Woody Seller Cardiologist - n/a  Chest x-ray - n/a EKG - today Stress Test - n/a ECHO - n/a Cardiac Cath - n/a  Sleep Study - n/a CPAP -   Fasting Blood Sugar - around 100 Checks Blood Sugar __1___ times a day A1C - 5.9 (2 weeks ago)  Blood Thinner Instructions: n/a Aspirin Instructions: Last dose 02/03/19  Anesthesia review: no  Patient denies shortness of breath, fever, cough and chest pain at PAT appointment   Patient verbalized understanding of instructions that were given to them at the PAT appointment. Patient was also instructed that they will need to review over the PAT instructions again at home before surgery.  Covid Test is scheduled for 02/09/19. Pt instructed on self quarantine after the Covid test. Pt voices understanding.   Coronavirus Screening  Have you experienced the following symptoms:  Cough NO Fever (>100.72F)  NO Runny nose NO Sore throat NO Difficulty breathing/shortness of breath  NO  Have you or a family member traveled in the last 14 days and where? NO   Patient reminded that hospital visitation restrictions are in effect and the importance of the restrictions.

## 2019-02-05 NOTE — Pre-Procedure Instructions (Signed)
Julian Dominguez  02/05/2019    Your procedure is scheduled on Tuesday, February 13, 2019 at 7:30 AM.    Report to [facility] Entrance "A" Admitting Office at 5:30 AM.   Call this number if you have problems the morning of surgery: [phone]   Questions prior to day of surgery, please call [phone] between 8 & 4 PM.   Remember:  Do not eat or drink after midnight Monday, 02/12/19.  Take these medicines the morning of surgery with A SIP OF WATER: Tramadol - if needed  Do not take Glimepiride (Amaryl) morning of surgery.  Stop Aspirin as instructed by your physician/surgeon. Stop NSAIDS (Voltaren (diclofenac), Ibuprofen, Aleve, etc), Multivitamins and Herbal medications 7 days prior to surgery. Do not use any other Aspirin products 7 days prior to surgery.   How to Manage Your Diabetes Before Surgery   Why is it important to control my blood sugar before and after surgery?   Improving blood sugar levels before and after surgery helps healing and can limit problems.  A way of improving blood sugar control is eating a healthy diet by:  - Eating less sugar and carbohydrates  - Increasing activity/exercise  - Talk with your doctor about reaching your blood sugar goals  High blood sugars (greater than 180 mg/dL) can raise your risk of infections and slow down your recovery so you will need to focus on controlling your diabetes during the weeks before surgery.  Make sure that the doctor who takes care of your diabetes knows about your planned surgery including the date and location.  How do I manage my blood sugars before surgery?   Check your blood sugar at least 4 times a day, 2 days before surgery to make sure that they are not too high or low.  Check your blood sugar the morning of your surgery when you wake up and every 2 hours until you get to the Short-Stay unit.  Treat a low blood sugar (less than 70 mg/dL) with 1/2 cup of clear juice (cranberry or apple),  4 glucose tablets, OR glucose gel.  Recheck blood sugar in 15 minutes after treatment (to make sure it is greater than 70 mg/dL).  If blood sugar is not greater than 70 mg/dL on re-check, call [phone] for further instructions.   Report your blood sugar to the Short-Stay nurse when you get to Short-Stay.  References:  [facility], 2007 "How to Manage your Diabetes Before and After Surgery".   Do not wear jewelry.  Do not wear lotions, powders, cologne or deodorant.  Men may shave face and neck.  Do not bring valuables to the hospital.  [facility] is not responsible for any belongings or valuables.  Contacts, dentures or bridgework may not be worn into surgery.  Leave your suitcase in the car.  After surgery it may be brought to your room.  For patients admitted to the hospital, discharge time will be determined by your treatment team.  [facility] - Preparing for Surgery  Before surgery, you can play an important role.  Because skin is not sterile, your skin needs to be as free of germs as possible.  You can reduce the number of germs on you skin by washing with CHG (chlorahexidine gluconate) soap before surgery.  CHG is an antiseptic cleaner which kills germs and bonds with the skin to continue killing germs even after washing.  Oral Hygiene is also important in reducing the risk of  infection.  Remember to brush your teeth with your regular toothpaste the morning of surgery.  Please DO NOT use if you have an allergy to CHG or antibacterial soaps.  If your skin becomes reddened/irritated stop using the CHG and inform your nurse when you arrive at Short Stay.  Do not shave (including legs and underarms) for at least 48 hours prior to the first CHG shower.  You may shave your face.  Please follow these instructions carefully:   1.  Shower with CHG Soap the night before surgery and the morning of Surgery.  2.  If you choose to wash your hair, wash your hair  first as usual with your normal shampoo.  3.  After you shampoo, rinse your hair and body thoroughly to remove the shampoo. 4.  Use CHG as you would any other liquid soap.  You can apply chg directly to the skin and wash gently with a      scrungie or washcloth.           5.  Apply the CHG Soap to your body ONLY FROM THE NECK DOWN.   Do not use on open wounds or open sores. Avoid contact with your eyes, ears, mouth and genitals (private parts).  Wash genitals (private parts) with your normal soap - do this first before using the CHG soap.  6.  Wash thoroughly, paying special attention to the area where your surgery will be performed.  7.  Thoroughly rinse your body with warm water from the neck down.  8.  DO NOT shower/wash with your normal soap after using and rinsing off the CHG Soap.  9.  Pat yourself dry with a clean towel.            10.  Wear clean pajamas.            11.  Place clean sheets on your bed the night of your first shower and do not sleep with pets.  Day of Surgery  Shower as above. Do not apply any lotions/deodorants the morning of surgery.   Please wear clean clothes to the hospital. Remember to brush your teeth with toothpaste.   Please read over the fact sheets that you were given.

## 2019-02-06 NOTE — Progress Notes (Addendum)
Anesthesia Chart Review:  Case: [id number] Date/Time: 02/13/19 0715   Procedures:      Left side Lumbar 2-3 Lumbar 3-4 Lumbar 4-5 Anterolateral decompression, posterior pedicle screw fixation (Left ) - Left side Lumbar 2-3 Lumbar 3-4 Lumbar 4-5 Anterolateral decompression, posterior pedicle screw fixation     LUMBAR PERCUTANEOUS PEDICLE SCREW 3 LEVEL (N/A )     APPLICATION OF ROBOTIC ASSISTANCE FOR SPINAL PROCEDURE (N/A )   Anesthesia type: General   Pre-op diagnosis: Lumbar stenosis with neurogenic claudication   Location: [facility] OR ROOM 20 / [facility]   Surgeon: Kristeen Miss, MD      DISCUSSION: Patient is a 78 year old male scheduled for the above procedure.   History includes former smoker (quit 1970), DM2, HTN, HLD, childhood murmur, neck surgery. BMI is consistent with obesity.  Last PCP office note, EKG (if available), echo (if available), A1c result requested from [facility]. Reportedly recent A1c was 5.9%. Dr. Woody Seller did sign a note of medical clearance for surgery classifying patient as "moderate risk" with permission to hold ASA for 10 days prior to surgery. Reported last ASA 02/03/19.   He denied SOB, chest pain, fever, cough at PAT RN visit.   Presurgical COVID-19 test is scheduled for 02/09/2019. Will follow-up once additional records received. (UPDATE 02/08/19 12:58 PM: PCP records received. No EKG or echo. He was seen by Dr. Woody Seller on 01/11/19 for preoperative clearance. He wrote, "Moderate risk. No cardiac symptoms, does have risk factors DM."  DM well controlled with A1c 5.9% at that time. Based on currently available information, I would anticipate that he can proceed as planned if no acute changes and COVID-19 test negative.)   VS: BP (!) 148/88   Pulse 97   Temp 36.5 C   Resp 20   Ht 5\' 7"  (1.702 m)   Wt 93 kg   SpO2 100%   BMI 32.12 kg/m     PROVIDERS: Glenda Chroman, MD is PCP [facility])   LABS: Labs reviewed: Acceptable for surgery. A1c was 5.9%  on 01/09/19.  (all labs ordered are listed, but only abnormal results are displayed)  Labs Reviewed  SURGICAL PCR SCREEN - Abnormal; Notable for the following components:      Result Value   Staphylococcus aureus POSITIVE (*)    All other components within normal limits  GLUCOSE, CAPILLARY - Abnormal; Notable for the following components:   Glucose-Capillary 104 (*)    All other components within normal limits  BASIC METABOLIC PANEL - Abnormal; Notable for the following components:   Glucose, Bld 108 (*)    BUN 25 (*)    All other components within normal limits  CBC - Abnormal; Notable for the following components:   RBC 4.17 (*)    All other components within normal limits  TYPE AND SCREEN     IMAGES: CT L-spine 02/05/19: IMPRESSION: 1. Severe spinal stenosis at L2-3 and L3-4 slightly worse than on the prior MRI scan. 2. Slight spinal stenosis at L4-5. 3. Severe right facet arthritis at L5-S1. 4. Aortic atherosclerosis. 5. Small stones in the left kidney. Aortic Atherosclerosis (ICD10-I70.0).   EKG: 02/05/19: Normal sinus rhythm Inferior infarct , age undetermined Abnormal ECG - Probable low R waves in aVF is new when compared to 01/14/10 tracing, other findings unchanged. No Q wave in II.     CV: N/A   Past Medical History:  Diagnosis Date  . Arthritis   . Cataract    removed  bilatterally  . Diabetes mellitus   . Diverticulosis    at 04-28-11 colon   . Heart murmur    as a child  . History of kidney stones   . Hyperlipidemia    takes red yeast rice   . Hypertension   . Tubular adenoma of colon 04/28/2011    Past Surgical History:  Procedure Laterality Date  . [place]   hardware  . CATARACT EXTRACTION, BILATERAL  2011  . CHOLECYSTECTOMY  1997  . COLONOSCOPY    . cyst removed from finger    . NECK SURGERY    . POLYPECTOMY      MEDICATIONS: . Ascorbic Acid (VITAMIN C) 1000 MG tablet  . aspirin 81 MG chewable tablet  . B  Complex-C (SUPER B COMPLEX PO)  . BLACK ELDERBERRY PO  . diclofenac (VOLTAREN) 75 MG EC tablet  . Garlic 3817 MG CAPS  . glimepiride (AMARYL) 2 MG tablet  . ibuprofen (ADVIL) 200 MG tablet  . losartan (COZAAR) 50 MG tablet  . Multiple Vitamin (MULTIVITAMIN) tablet  . traMADol (ULTRAM) 50 MG tablet  . vitamin E 400 UNIT capsule   . 0.9 %  sodium chloride infusion    Myra Gianotti, PA-C Surgical Short Stay/Anesthesiology [facility] Phone [phone] [facility] Phone [phone] 02/06/2019 4:18 PM

## 2019-02-07 ENCOUNTER — Other Ambulatory Visit (HOSPITAL_COMMUNITY): Payer: Medicare Other

## 2019-02-08 NOTE — Anesthesia Preprocedure Evaluation (Addendum)
Anesthesia Evaluation  Patient identified by MRN, date of birth, ID band Patient awake    Reviewed: Allergy & Precautions, NPO status , Patient's Chart, lab work & pertinent test results  History of Anesthesia Complications Negative for: history of anesthetic complications  Airway Mallampati: III  TM Distance: >3 FB Neck ROM: Full    Dental  (+) Dental Advisory Given   Pulmonary neg COPD, neg recent URI, former smoker,    breath sounds clear to auscultation       Cardiovascular hypertension, Pt. on medications  Rhythm:Regular     Neuro/Psych negative neurological ROS  negative psych ROS   GI/Hepatic negative GI ROS, Neg liver ROS,   Endo/Other  diabetes, Type 2  Renal/GU negative Renal ROS     Musculoskeletal  (+) Arthritis ,   Abdominal   Peds  Hematology negative hematology ROS (+)   Anesthesia Other Findings   Reproductive/Obstetrics                            Anesthesia Physical Anesthesia Plan  ASA: II  Anesthesia Plan: General   Post-op Pain Management:    Induction: Intravenous  PONV Risk Score and Plan: 2 and Ondansetron and Dexamethasone  Airway Management Planned: Oral ETT  Additional Equipment: None  Intra-op Plan:   Post-operative Plan: Extubation in OR  Informed Consent: I have reviewed the patients History and Physical, chart, labs and discussed the procedure including the risks, benefits and alternatives for the proposed anesthesia with the patient or authorized representative who has indicated his/her understanding and acceptance.     Dental advisory given  Plan Discussed with: CRNA and Surgeon  Anesthesia Plan Comments: (PAT note written by Myra Gianotti, PA-C. )       Anesthesia Quick Evaluation

## 2019-02-09 ENCOUNTER — Other Ambulatory Visit (HOSPITAL_COMMUNITY)
Admission: RE | Admit: 2019-02-09 | Discharge: 2019-02-09 | Disposition: A | Discharge status: Home / Self Care | Payer: Medicare Other | Source: Ambulatory Visit | Attending: Neurological Surgery | Admitting: Neurological Surgery

## 2019-02-09 ENCOUNTER — Other Ambulatory Visit: Payer: Self-pay

## 2019-02-09 DIAGNOSIS — Z1159 Encounter for screening for other viral diseases: Secondary | ICD-10-CM | POA: Diagnosis not present

## 2019-02-10 LAB — SARS CORONAVIRUS 2 (TAT 6-24 HRS): SARS Coronavirus 2: NEGATIVE

## 2019-02-13 ENCOUNTER — Inpatient Hospital Stay (HOSPITAL_COMMUNITY): Payer: Medicare Other | Admitting: Vascular Surgery

## 2019-02-13 ENCOUNTER — Inpatient Hospital Stay (HOSPITAL_COMMUNITY): Payer: Medicare Other | Admitting: Certified Registered Nurse Anesthetist

## 2019-02-13 ENCOUNTER — Encounter (HOSPITAL_COMMUNITY): Payer: Self-pay | Admitting: Critical Care Medicine

## 2019-02-13 ENCOUNTER — Inpatient Hospital Stay (HOSPITAL_COMMUNITY): Payer: Medicare Other

## 2019-02-13 ENCOUNTER — Inpatient Hospital Stay (HOSPITAL_COMMUNITY)
Admission: RE | Admit: 2019-02-13 | Discharge: 2019-02-14 | DRG: 458 | Disposition: A | Discharge status: Home / Self Care | Payer: Medicare Other | Attending: Neurological Surgery | Admitting: Neurological Surgery

## 2019-02-13 ENCOUNTER — Encounter (HOSPITAL_COMMUNITY)
Admission: RE | Disposition: A | Discharge status: Home / Self Care | Payer: Self-pay | Source: Home / Self Care | Attending: Neurological Surgery

## 2019-02-13 ENCOUNTER — Other Ambulatory Visit: Payer: Self-pay

## 2019-02-13 DIAGNOSIS — M4156 Other secondary scoliosis, lumbar region: Secondary | ICD-10-CM | POA: Diagnosis present

## 2019-02-13 DIAGNOSIS — M48062 Spinal stenosis, lumbar region with neurogenic claudication: Secondary | ICD-10-CM | POA: Diagnosis present

## 2019-02-13 DIAGNOSIS — M47816 Spondylosis without myelopathy or radiculopathy, lumbar region: Secondary | ICD-10-CM | POA: Diagnosis not present

## 2019-02-13 DIAGNOSIS — Z419 Encounter for procedure for purposes other than remedying health state, unspecified: Secondary | ICD-10-CM

## 2019-02-13 DIAGNOSIS — E119 Type 2 diabetes mellitus without complications: Secondary | ICD-10-CM | POA: Diagnosis present

## 2019-02-13 DIAGNOSIS — I10 Essential (primary) hypertension: Secondary | ICD-10-CM | POA: Diagnosis not present

## 2019-02-13 DIAGNOSIS — Z981 Arthrodesis status: Secondary | ICD-10-CM | POA: Diagnosis not present

## 2019-02-13 DIAGNOSIS — M4186 Other forms of scoliosis, lumbar region: Secondary | ICD-10-CM | POA: Diagnosis not present

## 2019-02-13 DIAGNOSIS — M5136 Other intervertebral disc degeneration, lumbar region: Secondary | ICD-10-CM | POA: Diagnosis not present

## 2019-02-13 HISTORY — PX: APPLICATION OF ROBOTIC ASSISTANCE FOR SPINAL PROCEDURE: SHX6753

## 2019-02-13 HISTORY — PX: LUMBAR PERCUTANEOUS PEDICLE SCREW 3 LEVEL: SHX5562

## 2019-02-13 HISTORY — PX: ANTERIOR LAT LUMBAR FUSION: SHX1168

## 2019-02-13 LAB — GLUCOSE, CAPILLARY
Glucose-Capillary: 129 mg/dL — ABNORMAL HIGH (ref 70–99)
Glucose-Capillary: 242 mg/dL — ABNORMAL HIGH (ref 70–99)
Glucose-Capillary: 245 mg/dL — ABNORMAL HIGH (ref 70–99)
Glucose-Capillary: 225 mg/dL — ABNORMAL HIGH (ref 70–99)
Glucose-Capillary: 173 mg/dL — ABNORMAL HIGH (ref 70–99)

## 2019-02-13 SURGERY — ANTERIOR LATERAL LUMBAR FUSION 3 LEVELS
Anesthesia: General

## 2019-02-13 MED ORDER — OXYCODONE HCL 5 MG PO TABS
5.0000 mg | ORAL_TABLET | Freq: Once | ORAL | Status: AC | PRN
  Administered 2019-02-13: 15:00:00 5 mg via ORAL

## 2019-02-13 MED ORDER — ONDANSETRON HCL 4 MG PO TABS: 4.0000 mg | ORAL_TABLET | Freq: Four times a day (QID) | ORAL | Status: DC | PRN

## 2019-02-13 MED ORDER — CHLORHEXIDINE GLUCONATE CLOTH 2 % EX PADS: 6.0000 | MEDICATED_PAD | Freq: Once | CUTANEOUS | Status: DC

## 2019-02-13 MED ORDER — MENTHOL 3 MG MT LOZG: 1.0000 | LOZENGE | OROMUCOSAL | Status: DC | PRN

## 2019-02-13 MED ORDER — ASPIRIN 81 MG PO CHEW
81.0000 mg | CHEWABLE_TABLET | Freq: Every day | ORAL | Status: DC
  Administered 2019-02-14: 08:00:00 81 mg via ORAL
  Filled 2019-02-13: qty 1

## 2019-02-13 MED ORDER — CEFAZOLIN SODIUM-DEXTROSE 2-4 GM/100ML-% IV SOLN
INTRAVENOUS | Status: AC
  Filled 2019-02-13: qty 100

## 2019-02-13 MED ORDER — MORPHINE SULFATE (PF) 2 MG/ML IV SOLN
2.0000 mg | INTRAVENOUS | Status: DC | PRN
  Administered 2019-02-13 (×2): 2 mg via INTRAVENOUS
  Filled 2019-02-13: qty 1

## 2019-02-13 MED ORDER — LIDOCAINE-EPINEPHRINE 1 %-1:100000 IJ SOLN
INTRAMUSCULAR | Status: AC
  Filled 2019-02-13: qty 1

## 2019-02-13 MED ORDER — GLIMEPIRIDE 2 MG PO TABS
2.0000 mg | ORAL_TABLET | Freq: Every day | ORAL | Status: DC
  Administered 2019-02-14: 08:00:00 2 mg via ORAL
  Filled 2019-02-13: qty 1

## 2019-02-13 MED ORDER — FENTANYL CITRATE (PF) 250 MCG/5ML IJ SOLN
INTRAMUSCULAR | Status: AC
  Filled 2019-02-13: qty 5

## 2019-02-13 MED ORDER — ACETAMINOPHEN 10 MG/ML IV SOLN
1000.0000 mg | Freq: Once | INTRAVENOUS | Status: DC | PRN
  Administered 2019-02-13: 14:00:00 1000 mg via INTRAVENOUS

## 2019-02-13 MED ORDER — PROPOFOL 10 MG/ML IV BOLUS
INTRAVENOUS | Status: DC | PRN
  Administered 2019-02-13: 140 mg via INTRAVENOUS

## 2019-02-13 MED ORDER — METHOCARBAMOL 500 MG PO TABS
ORAL_TABLET | ORAL | Status: AC
  Filled 2019-02-13: qty 1

## 2019-02-13 MED ORDER — KETOROLAC TROMETHAMINE 15 MG/ML IJ SOLN
INTRAMUSCULAR | Status: AC
  Filled 2019-02-13: qty 1

## 2019-02-13 MED ORDER — DEXAMETHASONE SODIUM PHOSPHATE 10 MG/ML IJ SOLN
INTRAMUSCULAR | Status: AC
  Filled 2019-02-13: qty 1

## 2019-02-13 MED ORDER — LIDOCAINE 2% (20 MG/ML) 5 ML SYRINGE
INTRAMUSCULAR | Status: AC
  Filled 2019-02-13: qty 5

## 2019-02-13 MED ORDER — SODIUM CHLORIDE 0.9% FLUSH: 3.0000 mL | INTRAVENOUS | Status: DC | PRN

## 2019-02-13 MED ORDER — ACETAMINOPHEN 500 MG PO TABS: 1000.0000 mg | ORAL_TABLET | Freq: Once | ORAL | Status: DC | PRN

## 2019-02-13 MED ORDER — DEXAMETHASONE SODIUM PHOSPHATE 10 MG/ML IJ SOLN
INTRAMUSCULAR | Status: DC | PRN
  Administered 2019-02-13: 4 mg via INTRAVENOUS

## 2019-02-13 MED ORDER — METHOCARBAMOL 1000 MG/10ML IJ SOLN
500.0000 mg | Freq: Four times a day (QID) | INTRAVENOUS | Status: DC | PRN
  Filled 2019-02-13: qty 5

## 2019-02-13 MED ORDER — LACTATED RINGERS IV SOLN
INTRAVENOUS | Status: DC | PRN
  Administered 2019-02-13: 07:00:00 via INTRAVENOUS

## 2019-02-13 MED ORDER — SODIUM CHLORIDE 0.9 % IV SOLN
INTRAVENOUS | Status: DC | PRN
  Administered 2019-02-13: 15 ug/min via INTRAVENOUS

## 2019-02-13 MED ORDER — CEFAZOLIN SODIUM-DEXTROSE 2-4 GM/100ML-% IV SOLN
2.0000 g | INTRAVENOUS | Status: AC
  Administered 2019-02-13: 2 g via INTRAVENOUS
  Administered 2019-02-13: 1 g via INTRAVENOUS

## 2019-02-13 MED ORDER — KETOROLAC TROMETHAMINE 15 MG/ML IJ SOLN
7.5000 mg | Freq: Four times a day (QID) | INTRAMUSCULAR | Status: AC
  Administered 2019-02-13 – 2019-02-14 (×4): 7.5 mg via INTRAVENOUS
  Filled 2019-02-13 (×3): qty 1

## 2019-02-13 MED ORDER — LACTATED RINGERS IV SOLN: INTRAVENOUS | Status: DC

## 2019-02-13 MED ORDER — SODIUM CHLORIDE 0.9% FLUSH
3.0000 mL | Freq: Two times a day (BID) | INTRAVENOUS | Status: DC
  Administered 2019-02-13: 23:00:00 10 mL via INTRAVENOUS

## 2019-02-13 MED ORDER — FENTANYL CITRATE (PF) 100 MCG/2ML IJ SOLN
INTRAMUSCULAR | Status: AC
  Filled 2019-02-13: qty 2

## 2019-02-13 MED ORDER — SUCCINYLCHOLINE CHLORIDE 200 MG/10ML IV SOSY
PREFILLED_SYRINGE | INTRAVENOUS | Status: DC | PRN
  Administered 2019-02-13: 80 mg via INTRAVENOUS

## 2019-02-13 MED ORDER — FENTANYL CITRATE (PF) 100 MCG/2ML IJ SOLN
25.0000 ug | INTRAMUSCULAR | Status: DC | PRN
  Administered 2019-02-13: 50 ug via INTRAVENOUS
  Administered 2019-02-13: 16:00:00 25 ug via INTRAVENOUS
  Administered 2019-02-13: 15:00:00 50 ug via INTRAVENOUS
  Administered 2019-02-13: 14:00:00 25 ug via INTRAVENOUS

## 2019-02-13 MED ORDER — CEFAZOLIN SODIUM-DEXTROSE 2-4 GM/100ML-% IV SOLN
2.0000 g | Freq: Three times a day (TID) | INTRAVENOUS | Status: AC
  Administered 2019-02-13 – 2019-02-14 (×2): 2 g via INTRAVENOUS
  Filled 2019-02-13: qty 100

## 2019-02-13 MED ORDER — DOCUSATE SODIUM 100 MG PO CAPS
100.0000 mg | ORAL_CAPSULE | Freq: Two times a day (BID) | ORAL | Status: DC
  Administered 2019-02-13 – 2019-02-14 (×2): 100 mg via ORAL
  Filled 2019-02-13 (×2): qty 1

## 2019-02-13 MED ORDER — PROPOFOL 500 MG/50ML IV EMUL
INTRAVENOUS | Status: DC | PRN
  Administered 2019-02-13: 50 ug/kg/min via INTRAVENOUS
  Administered 2019-02-13: 11:00:00 via INTRAVENOUS

## 2019-02-13 MED ORDER — INSULIN ASPART 100 UNIT/ML ~~LOC~~ SOLN
SUBCUTANEOUS | Status: AC
  Filled 2019-02-13: qty 1

## 2019-02-13 MED ORDER — LIDOCAINE 2% (20 MG/ML) 5 ML SYRINGE
INTRAMUSCULAR | Status: DC | PRN
  Administered 2019-02-13: 60 mg via INTRAVENOUS

## 2019-02-13 MED ORDER — THROMBIN 5000 UNITS EX SOLR
CUTANEOUS | Status: AC
  Filled 2019-02-13: qty 5000

## 2019-02-13 MED ORDER — EPHEDRINE SULFATE 50 MG/ML IJ SOLN
INTRAMUSCULAR | Status: DC | PRN
  Administered 2019-02-13: 5 mg via INTRAVENOUS
  Administered 2019-02-13: 10 mg via INTRAVENOUS
  Administered 2019-02-13 (×2): 5 mg via INTRAVENOUS
  Administered 2019-02-13: 10 mg via INTRAVENOUS

## 2019-02-13 MED ORDER — ACETAMINOPHEN 325 MG PO TABS
650.0000 mg | ORAL_TABLET | ORAL | Status: DC | PRN
  Administered 2019-02-14: 02:00:00 650 mg via ORAL
  Filled 2019-02-13: qty 2

## 2019-02-13 MED ORDER — BISACODYL 10 MG RE SUPP: 10.0000 mg | Freq: Every day | RECTAL | Status: DC | PRN

## 2019-02-13 MED ORDER — OXYCODONE HCL 5 MG PO TABS
ORAL_TABLET | ORAL | Status: AC
  Filled 2019-02-13: qty 1

## 2019-02-13 MED ORDER — THROMBIN 5000 UNITS EX SOLR
OROMUCOSAL | Status: DC | PRN
  Administered 2019-02-13: 07:00:00 5 mL via TOPICAL

## 2019-02-13 MED ORDER — ACETAMINOPHEN 10 MG/ML IV SOLN
INTRAVENOUS | Status: AC
  Filled 2019-02-13: qty 100

## 2019-02-13 MED ORDER — FLEET ENEMA 7-19 GM/118ML RE ENEM: 1.0000 | ENEMA | Freq: Once | RECTAL | Status: DC | PRN

## 2019-02-13 MED ORDER — FENTANYL CITRATE (PF) 250 MCG/5ML IJ SOLN
INTRAMUSCULAR | Status: DC | PRN
  Administered 2019-02-13 (×10): 50 ug via INTRAVENOUS

## 2019-02-13 MED ORDER — METHOCARBAMOL 500 MG PO TABS
500.0000 mg | ORAL_TABLET | Freq: Four times a day (QID) | ORAL | Status: DC | PRN
  Administered 2019-02-13: 15:00:00 500 mg via ORAL

## 2019-02-13 MED ORDER — BUPIVACAINE HCL (PF) 0.5 % IJ SOLN
INTRAMUSCULAR | Status: AC
  Filled 2019-02-13: qty 30

## 2019-02-13 MED ORDER — ACETAMINOPHEN 160 MG/5ML PO SOLN: 1000.0000 mg | Freq: Once | ORAL | Status: DC | PRN

## 2019-02-13 MED ORDER — OXYCODONE-ACETAMINOPHEN 5-325 MG PO TABS: 1.0000 | ORAL_TABLET | ORAL | Status: DC | PRN

## 2019-02-13 MED ORDER — TRAMADOL HCL 50 MG PO TABS
50.0000 mg | ORAL_TABLET | Freq: Four times a day (QID) | ORAL | Status: DC | PRN
  Administered 2019-02-14: 50 mg via ORAL
  Filled 2019-02-13: qty 1

## 2019-02-13 MED ORDER — BUPIVACAINE HCL (PF) 0.5 % IJ SOLN
INTRAMUSCULAR | Status: DC | PRN
  Administered 2019-02-13 (×2): 5 mL

## 2019-02-13 MED ORDER — POLYETHYLENE GLYCOL 3350 17 G PO PACK: 17.0000 g | PACK | Freq: Every day | ORAL | Status: DC | PRN

## 2019-02-13 MED ORDER — PHENOL 1.4 % MT LIQD: 1.0000 | OROMUCOSAL | Status: DC | PRN

## 2019-02-13 MED ORDER — INSULIN ASPART 100 UNIT/ML ~~LOC~~ SOLN
0.0000 [IU] | Freq: Three times a day (TID) | SUBCUTANEOUS | Status: DC
  Administered 2019-02-13 (×2): 5 [IU] via SUBCUTANEOUS
  Administered 2019-02-14: 3 [IU] via SUBCUTANEOUS

## 2019-02-13 MED ORDER — LOSARTAN POTASSIUM 50 MG PO TABS
50.0000 mg | ORAL_TABLET | Freq: Every day | ORAL | Status: DC
  Administered 2019-02-14: 08:00:00 50 mg via ORAL
  Filled 2019-02-13: qty 1

## 2019-02-13 MED ORDER — MORPHINE SULFATE (PF) 2 MG/ML IV SOLN
INTRAVENOUS | Status: AC
  Filled 2019-02-13: qty 1

## 2019-02-13 MED ORDER — 0.9 % SODIUM CHLORIDE (POUR BTL) OPTIME
TOPICAL | Status: DC | PRN
  Administered 2019-02-13: 07:00:00 1000 mL

## 2019-02-13 MED ORDER — LIDOCAINE-EPINEPHRINE 1 %-1:100000 IJ SOLN
INTRAMUSCULAR | Status: DC | PRN
  Administered 2019-02-13 (×2): 5 mL

## 2019-02-13 MED ORDER — SENNA 8.6 MG PO TABS
1.0000 | ORAL_TABLET | Freq: Two times a day (BID) | ORAL | Status: DC
  Administered 2019-02-13 – 2019-02-14 (×2): 8.6 mg via ORAL
  Filled 2019-02-13 (×2): qty 1

## 2019-02-13 MED ORDER — ONDANSETRON HCL 4 MG/2ML IJ SOLN: 4.0000 mg | Freq: Four times a day (QID) | INTRAMUSCULAR | Status: DC | PRN

## 2019-02-13 MED ORDER — ACETAMINOPHEN 650 MG RE SUPP: 650.0000 mg | RECTAL | Status: DC | PRN

## 2019-02-13 MED ORDER — ALUM & MAG HYDROXIDE-SIMETH 200-200-20 MG/5ML PO SUSP
30.0000 mL | Freq: Four times a day (QID) | ORAL | Status: DC | PRN
  Administered 2019-02-14: 30 mL via ORAL
  Filled 2019-02-13: qty 30

## 2019-02-13 MED ORDER — ONDANSETRON HCL 4 MG/2ML IJ SOLN
INTRAMUSCULAR | Status: AC
  Filled 2019-02-13: qty 2

## 2019-02-13 MED ORDER — SODIUM CHLORIDE 0.9 % IV SOLN: 250.0000 mL | INTRAVENOUS | Status: DC

## 2019-02-13 MED ORDER — ONDANSETRON HCL 4 MG/2ML IJ SOLN
INTRAMUSCULAR | Status: DC | PRN
  Administered 2019-02-13: 4 mg via INTRAVENOUS

## 2019-02-13 MED ORDER — OXYCODONE HCL 5 MG/5ML PO SOLN: 5.0000 mg | Freq: Once | ORAL | Status: AC | PRN

## 2019-02-13 MED ORDER — ARTIFICIAL TEARS OPHTHALMIC OINT
TOPICAL_OINTMENT | OPHTHALMIC | Status: AC
  Filled 2019-02-13: qty 7

## 2019-02-13 MED ORDER — GLYCOPYRROLATE PF 0.2 MG/ML IJ SOSY
PREFILLED_SYRINGE | INTRAMUSCULAR | Status: DC | PRN
  Administered 2019-02-13: .1 mg via INTRAVENOUS

## 2019-02-13 SURGICAL SUPPLY — 72 items
BAG DECANTER FOR FLEXI CONT (MISCELLANEOUS) ×3 IMPLANT
BIT DRILL LONG 3.0X30 (BIT) ×1 IMPLANT
BIT DRILL LONG 3X80 (BIT) IMPLANT
BIT DRILL LONG 4X80 (BIT) IMPLANT
BIT DRILL SHORT 3.0X30 (BIT) IMPLANT
BIT DRILL SHORT 3X80 (BIT) IMPLANT
BLADE CLIPPER SURG (BLADE) IMPLANT
BLADE SURG 11 STRL SS (BLADE) ×3 IMPLANT
BONE MATRIX OSTEOCEL PRO MED (Bone Implant) ×3 IMPLANT
BONE MATRIX OSTEOCEL PRO SM (Bone Implant) ×1 IMPLANT
CARTRIDGE OIL MAESTRO DRILL (MISCELLANEOUS) ×2 IMPLANT
CLIP NEUROVISION LG (CLIP) ×1 IMPLANT
CONT SPEC 4OZ CLIKSEAL STRL BL (MISCELLANEOUS) ×3 IMPLANT
COVER BACK TABLE 24X17X13 BIG (DRAPES) IMPLANT
COVER BACK TABLE 60X90IN (DRAPES) ×3 IMPLANT
COVER WAND RF STERILE (DRAPES) ×9 IMPLANT
DERMABOND ADVANCED (GAUZE/BANDAGES/DRESSINGS) ×5
DERMABOND ADVANCED .7 DNX12 (GAUZE/BANDAGES/DRESSINGS) ×6 IMPLANT
DIFFUSER DRILL AIR PNEUMATIC (MISCELLANEOUS) ×2 IMPLANT
DRAPE C-ARM 42X72 X-RAY (DRAPES) ×6 IMPLANT
DRAPE C-ARMOR (DRAPES) ×6 IMPLANT
DRAPE LAPAROTOMY 100X72X124 (DRAPES) ×6 IMPLANT
DRAPE POUCH INSTRU U-SHP 10X18 (DRAPES) ×2 IMPLANT
DRAPE SHEET LG 3/4 BI-LAMINATE (DRAPES) ×3 IMPLANT
DURAPREP 26ML APPLICATOR (WOUND CARE) ×6 IMPLANT
ELECT BLADE 4.0 EZ CLEAN MEGAD (MISCELLANEOUS)
ELECT REM PT RETURN 9FT ADLT (ELECTROSURGICAL) ×6
ELECTRODE BLDE 4.0 EZ CLN MEGD (MISCELLANEOUS) IMPLANT
ELECTRODE REM PT RTRN 9FT ADLT (ELECTROSURGICAL) ×4 IMPLANT
GAUZE 4X4 16PLY RFD (DISPOSABLE) IMPLANT
GLOVE BIOGEL PI IND STRL 8.5 (GLOVE) ×4 IMPLANT
GLOVE BIOGEL PI INDICATOR 8.5 (GLOVE) ×2
GLOVE ECLIPSE 8.5 STRL (GLOVE) ×7 IMPLANT
GLOVE EXAM NITRILE XL STR (GLOVE) IMPLANT
GOWN STRL REUS W/ TWL LRG LVL3 (GOWN DISPOSABLE) IMPLANT
GOWN STRL REUS W/ TWL XL LVL3 (GOWN DISPOSABLE) ×6 IMPLANT
GOWN STRL REUS W/TWL 2XL LVL3 (GOWN DISPOSABLE) ×11 IMPLANT
GOWN STRL REUS W/TWL LRG LVL3 (GOWN DISPOSABLE) ×2
GOWN STRL REUS W/TWL XL LVL3 (GOWN DISPOSABLE) ×1
GUIDEWIRE NITINOL BEVEL TIP (WIRE) ×8 IMPLANT
KIT BASIN OR (CUSTOM PROCEDURE TRAY) ×6 IMPLANT
KIT DILATOR XLIF 5 (KITS) ×1 IMPLANT
KIT SPINE MAZOR X ROBO DISP (MISCELLANEOUS) ×3 IMPLANT
KIT SURGICAL ACCESS MAXCESS 4 (KITS) ×1 IMPLANT
KIT TURNOVER KIT B (KITS) ×3 IMPLANT
MARKER SKIN DUAL TIP RULER LAB (MISCELLANEOUS) ×3 IMPLANT
MODULE NVM5 NEXT GEN EMG (NEEDLE) ×1 IMPLANT
MODULUS XLW 12X22X55MM 10 (Spine Construct) ×3 IMPLANT
NDL HYPO 25X1 1.5 SAFETY (NEEDLE) ×4 IMPLANT
NEEDLE HYPO 25X1 1.5 SAFETY (NEEDLE) ×6 IMPLANT
NS IRRIG 1000ML POUR BTL (IV SOLUTION) ×5 IMPLANT
OIL CARTRIDGE MAESTRO DRILL (MISCELLANEOUS)
PACK LAMINECTOMY NEURO (CUSTOM PROCEDURE TRAY) ×6 IMPLANT
PAD ARMBOARD 7.5X6 YLW CONV (MISCELLANEOUS) ×6 IMPLANT
PIN HEAD 2.5X60MM (PIN) IMPLANT
ROD RELINE MAS LORD 5.5X90MM (Rod) ×1 IMPLANT
ROD RELINE MAS LORD 5.5X95MM (Rod) ×1 IMPLANT
SCREW LOCK RELINE 5.5 TULIP (Screw) ×8 IMPLANT
SCREW MAS RELINE 6.5X50 POLY (Screw) ×4 IMPLANT
SCREW RELINE MAS 7.5X50MM POLY (Screw) ×4 IMPLANT
SCREW SCHANZ SA 4.0MM (MISCELLANEOUS) ×1 IMPLANT
SPONGE LAP 4X18 RFD (DISPOSABLE) IMPLANT
SPONGE SURGIFOAM ABS GEL SZ50 (HEMOSTASIS) IMPLANT
SUT VIC AB 1 CT1 18XBRD ANBCTR (SUTURE) ×2 IMPLANT
SUT VIC AB 1 CT1 8-18 (SUTURE)
SUT VIC AB 2-0 CP2 18 (SUTURE) ×11 IMPLANT
SUT VIC AB 3-0 SH 8-18 (SUTURE) ×11 IMPLANT
TOWEL GREEN STERILE (TOWEL DISPOSABLE) ×6 IMPLANT
TOWEL GREEN STERILE FF (TOWEL DISPOSABLE) ×6 IMPLANT
TRAY FOLEY MTR SLVR 16FR STAT (SET/KITS/TRAYS/PACK) ×6 IMPLANT
TUBE MAZOR SA REDUCTION (TUBING) ×3 IMPLANT
WATER STERILE IRR 1000ML POUR (IV SOLUTION) ×6 IMPLANT

## 2019-02-13 NOTE — Transfer of Care (Signed)
Immediate Anesthesia Transfer of Care Note  Patient: Julian Dominguez  Procedure(s) Performed: Left side Lumbar two-three Lumbar three-four Lumbar four-five Anterolateral decompression, posterior pedicle screw fixation (Left ) LUMBAR PERCUTANEOUS PEDICLE SCREW LUMBAR TWO TO LUMBAR FIVE (N/A ) APPLICATION OF ROBOTIC ASSISTANCE FOR SPINAL PROCEDURE (N/A )  Patient Location: PACU  Anesthesia Type:General  Level of Consciousness: sedated  Airway & Oxygen Therapy: Patient Spontanous Breathing and Patient connected to nasal cannula oxygen  Post-op Assessment: Report given to RN and Post -op Vital signs reviewed and stable  Post vital signs: Reviewed and stable  Last Vitals:  Vitals Value Taken Time  BP    Temp    Pulse 83 02/13/19 1327  Resp    SpO2 91 % 02/13/19 1327  Vitals shown include unvalidated device data.  Last Pain:  Vitals:   02/13/19 0708  TempSrc:   PainSc: 0-No pain      Patients Stated Pain Goal: 3 (11/94/17 4081)  Complications: No apparent anesthesia complications

## 2019-02-13 NOTE — Anesthesia Procedure Notes (Signed)
Procedure Name: Intubation Date/Time: 02/13/2019 7:59 AM Performed by: Wilburn Cornelia, CRNA Pre-anesthesia Checklist: Patient identified, Emergency Drugs available, Suction available, Patient being monitored and Timeout performed Patient Re-evaluated:Patient Re-evaluated prior to induction Oxygen Delivery Method: Circle system utilized Preoxygenation: Pre-oxygenation with 100% oxygen Induction Type: IV induction Ventilation: Mask ventilation without difficulty Laryngoscope Size: Mac Grade View: Grade II Tube type: Oral Tube size: 7.5 mm Number of attempts: 1 Airway Equipment and Method: Stylet Placement Confirmation: ETT inserted through vocal cords under direct vision,  positive ETCO2,  CO2 detector and breath sounds checked- equal and bilateral Secured at: 23 cm Tube secured with: Tape Dental Injury: Teeth and Oropharynx as per pre-operative assessment

## 2019-02-13 NOTE — H&P (Signed)
Chief complaint: Back pain bilateral leg pain and weakness History of present illness: Julian Dominguez is a 78 year old individual whom I have followed for a number of years with intermittent problems with back pain.  Over the years he is developed a degenerative scoliosis and spondylitic stenosis with the worst singular level being L4-5.  He also has a substantial curvature developing at L2-3 and L3-4.  He has recently undergone considerable treatment with epidural steroid injections a program of stretching and to no avail he has had continued back pain and now difficulty with walking.  After careful evaluation I advised a 3 level anterolateral decompression with posterior fixation to decompress and stabilize his stenosis and scoliosis both.  He is admitted for the surgery.  Past medical history: Patient's past medical history is noted in the chart with no new changes in the recent past.  Systems review: 14 point systems review done in the office demonstrates the patient has issues with balance disturbance problems with back pain inability to walk distances and easy fatigability.  Physical exam: Patient is alert and oriented.  Head is normocephalic neck has normal range of motion upper extremities have good strength and reflexes.  Lower extremities reveal some modest weakness in the tibialis anterior and gastrocs with the patient having difficulty toe and heel walking.  His gait is wide-based.  Deep tendon reflexes are absent in the patella and the Achilles in the lower extremities.  Romberg's test is positive.  Lungs are clear to auscultation the heart has regular rate and rhythm the abdomen is soft bowel sounds are positive no masses are noted extremities reveal no cyanosis clubbing or edema.  Impression: Patient has evidence of significant spondylitic stenosis at L2-3 L3-4 and L4-5 with degenerative scoliosis.  He is now to undergo an indirect decompression with an anterolateral decompression of the spaces  at L2 334 and 4 5 with posterior pedicle screw fixation using robotic assistance for screw placement.

## 2019-02-13 NOTE — Op Note (Signed)
Date of surgery: 02/13/2019 Preoperative diagnosis: Spondylosis and stenosis L2-3 L3-4 L4-5, degenerative scoliosis.  Neurogenic claudication. Postoperative diagnosis: Same Procedure: Anterolateral decompression L2-3 L3-4 L4-5 with ex lift technique placing 12 x 22 x 55 mm spacers with 10 degrees of lordosis in L2-3 L3-4 and L4-5. Posterior percutaneous pedicle fixation from L2-L5 with Mazor robotic assistance.  6.5 x 50 mm screws placed in L2 and L3 7.5 x 50 mm screws placed in L4 and L5 and 5.5 x 90 mm precontoured rod on the left and 5.5 x 95 mm rod precontoured on the right.  EMG monitoring Surgeon: Kristeen Miss First Assistant: Kary Kos MD Anesthesia: General endotracheal Indications: Mr. Julian Dominguez is a 78 year old individual whose had significant spondylitic disease in his lumbar spine over a number of years I have treated him conservatively with intermittent epidural injections trials of physical therapy and activity modification that have served him well for a number of years however lately he has had increasing problems with walking even short distances and increasing pain that is been intolerable.  He has some modest weakness in his legs and is developing a wide-based gait and was advised regarding the need for surgical decompression and stabilization of his lumbar spine.  He has a moderate degenerative scoliosis at the L2-3 and L3-4 levels along with significant stenosis at L4-L5.  Procedure: The patient was brought to the operating room supine on the stretcher.  After the smooth induction of general endotracheal anesthesia he was placed into the right lateral decubitus position on the operating table.  Fluoroscopic guidance was used to check orthogonal allergy of his position in the right lateral decubitus position and then he was taped in the position across the hips and across the chest.  EMG monitoring was performed throughout the entirety of this procedure and after marking the skin on  the lateral aspect on the left 4 incisions to approach L4-5 L3-4 and L2-3 2 separate incisions were created 1 4 L4-5 and the other for L2-3 and 3 4 posterior incision was made to locate the retroperitoneum.  Then by opening each of these incisions the retroperitoneal space was entered with a finger placed bluntly through the retroperitoneum.  A small cannula was then placed through the lateral incision and this was met with the finger in the retroperitoneal space to guided into the psoas muscle at L4-L5.  Once the cannula was located and checked radiographically a K wire was placed into the disc space.  Then EMG stimulation and monitoring was performed to make sure that no elements of the lumbar plexus were in the way.  A series of dilators was passed again using EMG monitoring to make sure that no elements were being entrapped by the progressive dilation and ultimately placement of a retractor measuring 140 mm in length.  This was then secured to the operating table in the orthogonal plane.  The retractor was gradually open and the soft tissues over the disc were uncovered.  A shim was placed posteriorly after checking to make sure that no neural elements were in this vicinity.  With a shim placed that the space was then opened with a #15 blade and a combination of curettes and rongeurs was used to evacuate a moderate amount of degenerated disc material.  Then a large Cobb dissector was placed through the disc space open the contralateral portion of the ligament.  This was done in the cephalad and caudad planes.  A series of dilators were then used to open  up the interspace and degenerated disc material was removed until the endplates were rongeured smooth.  When all the cartilaginous endplate material was removed and good bony contact was obtained the interspace was sized for an appropriate size spacers and it was felt that a 12 x 22 mm spacer measuring 55 mm in width would fit well with 10 degrees of lordosis.   This was placed in L4-L5 and filled with ostia cell allograft material.  Once the spacer was placed attention was turned to L3-4 with this same procedure was repeated through the superior more incision.  At L2-3 the same procedure was repeated again also using the same size spacer with 5 cc of ostia cell.  In the end in AP and lateral radiograph was obtained with all 3 spacers in good position the alignment being checked to be good and the spine looking much more neutral with reduction of the degenerative scoliotic curvature.  With this the retractors having all been removed the fascia was closed with 2-0 Vicryl in interrupted fashion 2-0 Vicryl was used in the subcutaneous tissues and 3-0 Vicryl subcuticularly.  Dermabond was placed on the skin.  Blood loss for this portion of the procedure was estimated at approximately 75 cc.  Next the patient was then turned prone onto a [place] table.  The bony prominences were appropriately padded and protected and the back was prepped with alcohol DuraPrep and draped in a sterile fashion.  The robotic arm was attached to the [place] table and then a Steinmann pin was secured to the right posterior superior iliac crest.  The robotic arm was then attached to the Steinmann pin and fluoroscopy was used to secure in AP and oblique radiograph for registration of the robotic program.  Then pedicle entry sites were chosen for L2 L3-L4-L5 after the robotic protocol was run and skin entry sites were chosen.  2 paramedian vertical incisions were created in the lumbar spine after infiltrating with lidocaine and epinephrine.  The dissection was taken down to the deep subcutaneous tissues.  The robotic arm was then used to locate the first pedicle screw at L2 and by using the robotic arm a guide tube was passed over the pedicle entry site and the pedicle entry site was then secured with a sharp guide that cut into the bone and a hole was drilled for K wire placement K wires were placed  sequentially using the same technique for L2-L5 on both sides.  Once the K wires were placed using EMG monitoring the bottom holes were were tapped with EMG monitoring being attached to the tap to the 7.5 mm size 7.5 x 50 mm screws were placed in L4 and L5.  At L3 and L2 6.5 x 50 mm screws were placed without using a tap but EMG monitoring was employed no evidence of cut out was noted.  Then rods were placed through the superior border of the incision and on the right side a 95 mm precontoured rod was placed and secured on the left side a 90 mm rod was placed and secured final radiographs were obtained in AP and lateral projections.  When these were noted to be adequate then hemostasis in the wounds was obtained and the subcutaneous tissue was closed with 2-0 Vicryl and 3-0 Vicryl was used in the subcuticular tissues.  Dermabond was placed on the skin.  A Steinmann pin was removed and this was secured with a 3-0 Vicryl suture.  Blood loss for this portion of the procedure  was estimated at 100 cc patient was returned to recovery room in stable condition.

## 2019-02-14 ENCOUNTER — Encounter (HOSPITAL_COMMUNITY): Payer: Self-pay | Admitting: Neurological Surgery

## 2019-02-14 LAB — GLUCOSE, CAPILLARY: Glucose-Capillary: 164 mg/dL — ABNORMAL HIGH (ref 70–99)

## 2019-02-14 MED ORDER — OXYCODONE-ACETAMINOPHEN 5-325 MG PO TABS: 1.0000 | ORAL_TABLET | ORAL | 0 refills | Status: DC | PRN

## 2019-02-14 MED ORDER — METHOCARBAMOL 500 MG PO TABS: 500.0000 mg | ORAL_TABLET | Freq: Four times a day (QID) | ORAL | 3 refills | Status: DC | PRN

## 2019-02-14 NOTE — Anesthesia Postprocedure Evaluation (Signed)
Anesthesia Post Note  Patient: Julian Dominguez  Procedure(s) Performed: Left side Lumbar two-three Lumbar three-four Lumbar four-five Anterolateral decompression, posterior pedicle screw fixation (Left ) LUMBAR PERCUTANEOUS PEDICLE SCREW LUMBAR TWO TO LUMBAR FIVE (N/A ) APPLICATION OF ROBOTIC ASSISTANCE FOR SPINAL PROCEDURE (N/A )     Patient location during evaluation: PACU Anesthesia Type: General Level of consciousness: awake and alert Pain management: pain level controlled Vital Signs Assessment: post-procedure vital signs reviewed and stable Respiratory status: spontaneous breathing, nonlabored ventilation, respiratory function stable and patient connected to nasal cannula oxygen Cardiovascular status: blood pressure returned to baseline and stable Postop Assessment: no apparent nausea or vomiting Anesthetic complications: no    Last Vitals:  Vitals:   02/14/19 0400 02/14/19 0749  BP:  138/75  Pulse:  85  Resp:  20  Temp: 37.1 C 37.1 C  SpO2:  98%    Last Pain:  Vitals:   02/14/19 0749  TempSrc: Oral  PainSc:                  Jaclin Finks

## 2019-02-14 NOTE — Progress Notes (Signed)
Orthopedic Tech Progress Note Patient Details:  Julian Dominguez 10/22/1940 [id number] RN said patient has back brace already Patient ID: Julian Dominguez, male   DOB: Sep 26, 1940, 78 y.o.   MRN: [id number]   Julian Dominguez 02/14/2019, 8:47 AM

## 2019-02-14 NOTE — Evaluation (Signed)
Physical Therapy Evaluation Patient Details Name: Julian Dominguez MRN: [id number] DOB: 07/12/1941 Today's Date: 02/14/2019   History of Present Illness  78 yo male HOH s/p Anterolaterla decompression L2-3 L3-4 L4-5 with ex lift technique placiing spacers . Posterior percutaneous pedicle fixation L2-5  PMH: neck surg, obese, former smoker, DM2, HTn, childhood murmur   Clinical Impression  Patient is s/p above surgery resulting in the deficits listed below (see PT Problem List). Prior to admission, pt lives with his wife and is independent with mobility/ADL's. 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 On PT evaluation, pt with seemingly good pain control, appearing to be close to baseline. Ambulating hallway distances at a supervision level with no assistive device. Education re: spinal precautions, activity modifications, generalized walking program. Patient will benefit from skilled PT to increase their independence and safety with mobility (while adhering to their precautions) to allow discharge to the venue listed below.     Follow Up Recommendations No PT follow up;Supervision - Intermittent    Equipment Recommendations  None recommended by PT    Recommendations for Other Services       Precautions / Restrictions Precautions Precautions: Back Precaution Comments: back handout provided and reviewed for adls Required Braces or Orthoses: Spinal Brace Spinal Brace: Applied in sitting position;Lumbar corset Restrictions Weight Bearing Restrictions: No      Mobility  Bed Mobility               General bed mobility comments: OOB in chair, reviewed log roll technique  Transfers Overall transfer level: Needs assistance Equipment used: None Transfers: Sit to/from Stand Sit to Stand: Supervision            Ambulation/Gait Ambulation/Gait assistance: Supervision Gait Distance (Feet): 200 Feet Assistive device: None Gait Pattern/deviations: Step-through  pattern;Decreased stride length;Antalgic Gait velocity: decreased   General Gait Details: Slightly antalgic gait pattern with decreased bilateral foot clearance, right foot external rotation noted  Stairs            Wheelchair Mobility    Modified Rankin (Stroke Patients Only)       Balance Overall balance assessment: Mild deficits observed, not formally tested                                           Pertinent Vitals/Pain Pain Assessment: Faces Faces Pain Scale: Hurts a little bit Pain Location: surgical site Pain Descriptors / Indicators: Sore Pain Intervention(s): Monitored during session;Premedicated before session    Home Living Family/patient expects to be discharged to:: Private residence Living Arrangements: Spouse/significant other Available Help at Discharge: Family;Available 24 hours/day Type of Home: House Home Access: Level entry     Home Layout: One level Home Equipment: Shower seat;Adaptive equipment      Prior Function Level of Independence: Independent         Comments: Retired Airline pilot, 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, newspaper   Dominant Hand: Right    Extremity/Trunk Assessment   Upper Extremity Assessment Upper Extremity Assessment: Defer to OT evaluation    Lower Extremity Assessment Lower Extremity Assessment: Overall WFL for tasks assessed    Cervical / Trunk Assessment Cervical / Trunk Assessment: Other exceptions Cervical / Trunk Exceptions: s/p spinal sx  Communication   Communication: HOH  Cognition Arousal/Alertness: Awake/alert Behavior During Therapy: WFL for tasks assessed/performed Overall  Cognitive Status: Within Functional Limits for tasks assessed                                        General Comments      Exercises     Assessment/Plan    PT Assessment Patient needs continued PT services  PT Problem List Decreased  strength;Decreased balance;Decreased mobility;Pain;Decreased knowledge of precautions       PT Treatment Interventions Gait training;Stair training;Functional mobility training;Therapeutic activities;Therapeutic exercise;Balance training;Patient/family education    PT Goals (Current goals can be found in the Care Plan section)  Acute Rehab PT Goals Patient Stated Goal: "Ride my lawnmower." PT Goal Formulation: With patient Time For Goal Achievement: 02/28/19 Potential to Achieve Goals: Good    Frequency Min 5X/week   Barriers to discharge        Co-evaluation               AM-PAC PT "6 Clicks" Mobility  Outcome Measure Help needed turning from your back to your side while in a flat bed without using bedrails?: None Help needed moving from lying on your back to sitting on the side of a flat bed without using bedrails?: None Help needed moving to and from a bed to a chair (including a wheelchair)?: None Help needed standing up from a chair using your arms (e.g., wheelchair or bedside chair)?: None Help needed to walk in hospital room?: None Help needed climbing 3-5 steps with a railing? : A Little 6 Click Score: 23    End of Session Equipment Utilized During Treatment: Back brace Activity Tolerance: Patient tolerated treatment well Patient left: in chair;with call bell/phone within reach Nurse Communication: Mobility status PT Visit Diagnosis: Pain;Difficulty in walking, not elsewhere classified (R26.2) Pain - part of body: (back)    Time: 0017-4944 PT Time Calculation (min) (ACUTE ONLY): 14 min   Charges:   PT Evaluation $PT Eval Low Complexity: 1 Low          Ellamae Sia, PT, DPT Acute Rehabilitation Services Pager [phone] Office [phone]   Willy Eddy 02/14/2019, 9:11 AM

## 2019-02-14 NOTE — Progress Notes (Signed)
Vital signs are stable patient is up and ambulatory Feels reasonably comfortable today Incisions are clean and dry We will discharge home

## 2019-02-14 NOTE — Evaluation (Signed)
Occupational Therapy Evaluation Patient Details Name: Julian Dominguez MRN: [id number] DOB: [birth year] Today's Date: 02/14/2019    History of Present Illness 78 yo male HOH s/p Anterolaterla decompression L2-3 L3-4 L4-5 with ex lift technique placiing spacers . Posterior percutaneous pedicle fixation L2-5  PMH: neck surg, obese, former smoker, DM2, HTn, childhood murmur    Clinical Impression   Patient evaluated by Occupational Therapy with no further acute OT needs identified. All education has been completed and the patient has no further questions. See below for any follow-up Occupational Therapy or equipment needs. OT to sign off. Thank you for referral.      Follow Up Recommendations  No OT follow up    Equipment Recommendations  None recommended by OT    Recommendations for Other Services       Precautions / Restrictions Precautions Precautions: Back Precaution Comments: back handout provided and reviewed for adls Required Braces or Orthoses: Spinal Brace Spinal Brace: Applied in sitting position;Lumbar corset Restrictions Weight Bearing Restrictions: No      Mobility Bed Mobility               General bed mobility comments: oob in chair on arrival  Transfers Overall transfer level: Needs assistance Equipment used: None Transfers: Sit to/from Stand Sit to Stand: Modified independent (Device/Increase time)              Balance Overall balance assessment: Mild deficits observed, not formally tested                                         ADL either performed or assessed with clinical judgement   ADL Overall ADL's : Needs assistance/impaired Eating/Feeding: Independent   Grooming: Wash/dry hands   Upper Body Bathing: Minimal assistance   Lower Body Bathing: Minimal assistance   Upper Body Dressing : Minimal assistance Upper Body Dressing Details (indicate cue type and reason): requires (A) to place L UE in shirt  sleeve Lower Body Dressing: Minimal assistance Lower Body Dressing Details (indicate cue type and reason): Requires (A) to dress L LE. pt reports wife can help and has reacher at [employer] Transfer: Modified Independent     Toileting - Clothing Manipulation Details (indicate cue type and reason): educated on toilet hyigene     Functional mobility during ADLs: Modified independent General ADL Comments: pt will have wife (A) for any adl needs per patient  Back handout provided and reviewed adls in detail. Pt educated on: clothing between brace, never sleep in brace, sitting options in the home for straight back sitting, avoid sitting for long periods of time avoiding lifting more than 5 pounds and never wash directly over incision. All education is complete and patient indicates understanding.     Vision Baseline Vision/History: (reports not glasses)       Perception     Praxis      Pertinent Vitals/Pain Pain Assessment: Faces Faces Pain Scale: Hurts a little bit Pain Location: surgical site Pain Descriptors / Indicators: Sore Pain Intervention(s): Monitored during session;Repositioned     Hand Dominance Right   Extremity/Trunk Assessment Upper Extremity Assessment Upper Extremity Assessment: RUE deficits/detail;LUE deficits/detail RUE Deficits / Details: reports baseline shoulder deficits per patient. pt able to complete AROM 80 degrees LUE Deficits / Details: baseline deficits reported by patient and demonstrates AROM 70 degrees. pt states "i guess ill be getting this done next"  Lower Extremity Assessment Lower Extremity Assessment: Overall WFL for tasks assessed   Cervical / Trunk Assessment Cervical / Trunk Assessment: Other exceptions Cervical / Trunk Exceptions: s/p surgery   Communication Communication Communication: HOH   Cognition Arousal/Alertness: Awake/alert Behavior During Therapy: WFL for tasks assessed/performed Overall Cognitive Status: Within  Functional Limits for tasks assessed                                     General Comments  educated on washing around wounds and use of clean wash cloth. pt reading handout fully and asking a few questions about hygiene for toileting    Exercises     Shoulder Instructions      Home Living Family/patient expects to be discharged to:: Private residence Living Arrangements: Spouse/significant other Available Help at Discharge: Family;Available 24 hours/day Type of Home: House Home Access: Level entry     Home Layout: One level     Bathroom Shower/Tub: Occupational psychologist: Standard     Home Equipment: Shower seat;Adaptive equipment Adaptive Equipment: Reacher Additional Comments: lives on a farm and son in law is mowing the yard for him . 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        OT Problem List: Impaired balance (sitting and/or standing)      OT Treatment/Interventions:      OT Goals(Current goals can be found in the care plan section) Acute Rehab OT Goals Patient Stated Goal: to go home today- pt educated no mowing yards for 12 weeks no driving until follow up with MD  OT Frequency:     Barriers to D/C:            Co-evaluation              AM-PAC OT "6 Clicks" Daily Activity     Outcome Measure Help from another person eating meals?: None Help from another person taking care of personal grooming?: None Help from another person toileting, which includes using toliet, bedpan, or urinal?: A Little Help from another person bathing (including washing, rinsing, drying)?: A Little Help from another person to put on and taking off regular upper body clothing?: None Help from another person to put on and taking off regular lower body clothing?: A Little 6 Click Score: 21    End of Session Equipment Utilized During Treatment: Back brace Nurse Communication: Mobility status;Precautions  Activity Tolerance: Patient tolerated treatment well Patient left: in chair;with call bell/phone within reach  OT Visit Diagnosis: Unsteadiness on feet (R26.81)                Time: 6384-5364 OT Time Calculation (min): 21 min Charges:  OT General Charges $OT Visit: 1 Visit OT Evaluation $OT Eval Moderate Complexity: 1 Mod   Jeri Modena, OTR/L  Acute Rehabilitation Services Pager: [phone] Office: [phone] .   Jeri Modena 02/14/2019, 10:18 AM

## 2019-02-14 NOTE — Discharge Summary (Signed)
Date of admission 02/13/2019 Date of discharge: 02/14/2019 Admitting diagnosis: Lumbar spondylosis and stenosis L2-3 L3-4 L4-5, lumbar radiculopathy, neurogenic claudication, degenerative scoliosis Discharge and final diagnoses: Lumbar spondylosis and stenosis L2-3 L3-4 L4-5.  Lumbar radiculopathy.  Neurogenic claudication.  Degenerative scoliosis. Condition on discharge: Improved Hospital course: The patient was admitted to undergo surgical decompression and stabilization via an anterolateral approach at L2-3 L3-4 and L4-5.  He tolerated that surgery well the second portion was to perform posterior stabilization with pedicular fixation from L2-L5.  He tolerated this surgery well with minimal blood loss and postoperatively has had good relief of much of his preoperative pain though he has some soreness in his back from the surgery itself his incisions are clean and dry and he is ambulatory he is independent in activity and he is discharged home at this time for further follow-up as an outpatient.

## 2019-02-14 NOTE — Progress Notes (Addendum)
Pt had voided 200cc, PVR bladder scan performed as Dr Ellene Route had requested be done with pt after his next void. (pt had only voided 50cc with first void this morning after foley had been d/c'd early am).   Bladder scan <100cc.    Discharge instructions reviewed with pt.  Copy of instructions given to pt. Pt scripts sent to his pharmacy and pt made aware. (notified Dr Ellene Route of need for robaxin script to be sent to pt's pharmacy) Waiting for wife to arrive. When called she was on the other end of [employer], at [employer] and will be here shortly she stated. She was instructed to call nurses number back when she has arrived to the hospital entrance.    At 1327  Pt d/c'd via wheelchair with belongings, wife at main entrance at this time.             Escorted by unit staff.

## 2019-03-14 DIAGNOSIS — M48062 Spinal stenosis, lumbar region with neurogenic claudication: Secondary | ICD-10-CM | POA: Diagnosis not present

## 2019-04-06 DIAGNOSIS — Z299 Encounter for prophylactic measures, unspecified: Secondary | ICD-10-CM | POA: Diagnosis not present

## 2019-04-06 DIAGNOSIS — Z87891 Personal history of nicotine dependence: Secondary | ICD-10-CM | POA: Diagnosis not present

## 2019-04-06 DIAGNOSIS — L089 Local infection of the skin and subcutaneous tissue, unspecified: Secondary | ICD-10-CM | POA: Diagnosis not present

## 2019-04-06 DIAGNOSIS — Z6831 Body mass index (BMI) 31.0-31.9, adult: Secondary | ICD-10-CM | POA: Diagnosis not present

## 2019-04-06 DIAGNOSIS — I10 Essential (primary) hypertension: Secondary | ICD-10-CM | POA: Diagnosis not present

## 2019-04-06 DIAGNOSIS — L723 Sebaceous cyst: Secondary | ICD-10-CM | POA: Diagnosis not present

## 2019-04-06 DIAGNOSIS — E1165 Type 2 diabetes mellitus with hyperglycemia: Secondary | ICD-10-CM | POA: Diagnosis not present

## 2019-04-13 DIAGNOSIS — Z6831 Body mass index (BMI) 31.0-31.9, adult: Secondary | ICD-10-CM | POA: Diagnosis not present

## 2019-04-13 DIAGNOSIS — K432 Incisional hernia without obstruction or gangrene: Secondary | ICD-10-CM | POA: Diagnosis not present

## 2019-04-13 DIAGNOSIS — L720 Epidermal cyst: Secondary | ICD-10-CM | POA: Diagnosis not present

## 2019-04-13 DIAGNOSIS — M6208 Separation of muscle (nontraumatic), other site: Secondary | ICD-10-CM | POA: Diagnosis not present

## 2019-05-03 DIAGNOSIS — Z713 Dietary counseling and surveillance: Secondary | ICD-10-CM | POA: Diagnosis not present

## 2019-05-03 DIAGNOSIS — Z6831 Body mass index (BMI) 31.0-31.9, adult: Secondary | ICD-10-CM | POA: Diagnosis not present

## 2019-05-03 DIAGNOSIS — E1165 Type 2 diabetes mellitus with hyperglycemia: Secondary | ICD-10-CM | POA: Diagnosis not present

## 2019-05-03 DIAGNOSIS — I10 Essential (primary) hypertension: Secondary | ICD-10-CM | POA: Diagnosis not present

## 2019-05-03 DIAGNOSIS — Z299 Encounter for prophylactic measures, unspecified: Secondary | ICD-10-CM | POA: Diagnosis not present

## 2019-05-11 DIAGNOSIS — Z23 Encounter for immunization: Secondary | ICD-10-CM | POA: Diagnosis not present

## 2019-05-16 DIAGNOSIS — M48062 Spinal stenosis, lumbar region with neurogenic claudication: Secondary | ICD-10-CM | POA: Diagnosis not present

## 2019-06-01 DIAGNOSIS — M19012 Primary osteoarthritis, left shoulder: Secondary | ICD-10-CM | POA: Diagnosis not present

## 2019-06-01 DIAGNOSIS — M19011 Primary osteoarthritis, right shoulder: Secondary | ICD-10-CM | POA: Diagnosis not present

## 2019-06-01 DIAGNOSIS — M25511 Pain in right shoulder: Secondary | ICD-10-CM | POA: Diagnosis not present

## 2019-06-01 DIAGNOSIS — M25512 Pain in left shoulder: Secondary | ICD-10-CM | POA: Diagnosis not present

## 2019-06-20 NOTE — Patient Instructions (Addendum)
DUE TO COVID-19 ONLY ONE VISITOR IS ALLOWED TO COME WITH YOU AND STAY IN THE WAITING ROOM ONLY DURING PRE OP AND PROCEDURE DAY OF SURGERY. THE 1 VISITOR MAY VISIT WITH YOU AFTER SURGERY IN YOUR PRIVATE ROOM DURING VISITING HOURS ONLY!  YOU NEED TO HAVE A COVID 19 TEST ON_Friday 12/04/2020______ @___1120  am____, THIS TEST MUST BE DONE BEFORE SURGERY, COME  [address] , [id number].  ([place]) ONCE YOUR COVID TEST IS COMPLETED, PLEASE BEGIN THE QUARANTINE INSTRUCTIONS AS OUTLINED IN YOUR HANDOUT.                Nolon Hanneman     Your procedure is scheduled on: Tuesday 06/26/2019   Report to [facility] Main  Entrance    Report to admitting at  1050 AM   How to Manage Your Diabetes Before and After Surgery  Why is it important to control my blood sugar before and after surgery? . Improving blood sugar levels before and after surgery helps healing and can limit problems. . A way of improving blood sugar control is eating a healthy diet by: o  Eating less sugar and carbohydrates o  Increasing activity/exercise o  Talking with your doctor about reaching your blood sugar goals . High blood sugars (greater than 180 mg/dL) can raise your risk of infections and slow your recovery, so you will need to focus on controlling your diabetes during the weeks before surgery. . Make sure that the doctor who takes care of your diabetes knows about your planned surgery including the date and location.  How do I manage my blood sugar before surgery? . Check your blood sugar at least 4 times a day, starting 2 days before surgery, to make sure that the level is not too high or low. o Check your blood sugar the morning of your surgery when you wake up and every 2 hours until you get to the Short Stay unit. . If your blood sugar is less than 70 mg/dL, you will need to treat for low blood sugar: o Do not take insulin. o Treat a low blood sugar (less than 70  mg/dL) with  cup of clear juice (cranberry or apple), 4 glucose tablets, OR glucose gel. o Recheck blood sugar in 15 minutes after treatment (to make sure it is greater than 70 mg/dL). If your blood sugar is not greater than 70 mg/dL on recheck, call [phone] for further instructions. . Report your blood sugar to the short stay nurse when you get to Short Stay.  . If you are admitted to the hospital after surgery: o Your blood sugar will be checked by the staff and you will probably be given insulin after surgery (instead of oral diabetes medicines) to make sure you have good blood sugar levels. o The goal for blood sugar control after surgery is 80-180 mg/dL.   WHAT DO I DO ABOUT MY DIABETES MEDICATION?        The day before surgery, Take morning dose ONLY of Glimepiride (Amaryl)!  . Do not take oral diabetes medicines (pills) the morning of surgery.     Call this number if you have problems the morning of surgery [phone]    Remember: Do not eat food  :After Midnight.    NO SOLID FOOD AFTER MIDNIGHT THE NIGHT PRIOR TO SURGERY. NOTHING BY MOUTH EXCEPT CLEAR LIQUIDS UNTIL  1020 am .     PLEASE FINISH Gatorade Zero  DRINK PER SURGEON  ORDER  WHICH NEEDS TO BE COMPLETED AT  1020 am.   CLEAR LIQUID DIET   Foods Allowed                                                                     Foods Excluded  Coffee and tea, regular and decaf                             liquids that you cannot  Plain Jell-O any favor except red or purple                                           see through such as: Fruit ices (not with fruit pulp)                                     milk, soups, orange juice  Iced Popsicles                                    All solid food Carbonated beverages, regular and diet                                    Cranberry, grape and apple juices Sports drinks like Gatorade Lightly seasoned clear broth or consume(fat free) Sugar, honey syrup  Sample  Menu Breakfast                                Lunch                                     Supper Cranberry juice                    Beef broth                            Chicken broth Jell-O                                     Grape juice                           Apple juice Coffee or tea                        Jell-O                                      Popsicle  Coffee or tea                        Coffee or tea  _____________________________________________________________________     BRUSH YOUR TEETH MORNING OF SURGERY AND RINSE YOUR MOUTH OUT, NO CHEWING GUM CANDY OR MINTS.     Take these medicines the morning of surgery with A SIP OF WATER: none   DO NOT TAKE ANY DIABETIC MEDICATIONS DAY OF YOUR SURGERY!                               You may not have any metal on your body including hair pins and              piercings  Do not wear jewelry, make-up, lotions, powders or perfumes, deodorant                          Men may shave face and neck.   Do not bring valuables to the hospital. Kathleen.  Contacts, dentures or bridgework may not be worn into surgery.  Leave suitcase in the car. After surgery it may be brought to your room.                   Please read over the following fact sheets you were given: _____________________________________________________________________             [facility] - Preparing for Surgery Before surgery, you can play an important role.  Because skin is not sterile, your skin needs to be as free of germs as possible.  You can reduce the number of germs on your skin by washing with CHG (chlorahexidine gluconate) soap before surgery.  CHG is an antiseptic cleaner which kills germs and bonds with the skin to continue killing germs even after washing. Please DO NOT use if you have an allergy to CHG or antibacterial soaps.  If your skin becomes  reddened/irritated stop using the CHG and inform your nurse when you arrive at Short Stay. Do not shave (including legs and underarms) for at least 48 hours prior to the first CHG shower.  You may shave your face/neck. Please follow these instructions carefully:  1.  Shower with CHG Soap the night before surgery and the  morning of Surgery.  2.  If you choose to wash your hair, wash your hair first as usual with your  normal  shampoo.  3.  After you shampoo, rinse your hair and body thoroughly to remove the  shampoo.                           4.  Use CHG as you would any other liquid soap.  You can apply chg directly  to the skin and wash                       Gently with a scrungie or clean washcloth.  5.  Apply the CHG Soap to your body ONLY FROM THE NECK DOWN.   Do not use on face/ open                           Wound or open sores.  Avoid contact with eyes, ears mouth and genitals (private parts).                       Wash face,  Genitals (private parts) with your normal soap.             6.  Wash thoroughly, paying special attention to the area where your surgery  will be performed.  7.  Thoroughly rinse your body with warm water from the neck down.  8.  DO NOT shower/wash with your normal soap after using and rinsing off  the CHG Soap.                9.  Pat yourself dry with a clean towel.            10.  Wear clean pajamas.            11.  Place clean sheets on your bed the night of your first shower and do not  sleep with pets. Day of Surgery : Do not apply any lotions/deodorants the morning of surgery.  Please wear clean clothes to the hospital/surgery center.  FAILURE TO FOLLOW THESE INSTRUCTIONS MAY RESULT IN THE CANCELLATION OF YOUR SURGERY PATIENT SIGNATURE_________________________________  NURSE SIGNATURE__________________________________  ________________________________________________________________________   Adam Phenix  An incentive spirometer is a tool that  can help keep your lungs clear and active. This tool measures how well you are filling your lungs with each breath. Taking long deep breaths may help reverse or decrease the chance of developing breathing (pulmonary) problems (especially infection) following:  A long period of time when you are unable to move or be active. BEFORE THE PROCEDURE   If the spirometer includes an indicator to show your best effort, your nurse or respiratory therapist will set it to a desired goal.  If possible, sit up straight or lean slightly forward. Try not to slouch.  Hold the incentive spirometer in an upright position. INSTRUCTIONS FOR USE  1. Sit on the edge of your bed if possible, or sit up as far as you can in bed or on a chair. 2. Hold the incentive spirometer in an upright position. 3. Breathe out normally. 4. Place the mouthpiece in your mouth and seal your lips tightly around it. 5. Breathe in slowly and as deeply as possible, raising the piston or the ball toward the top of the column. 6. Hold your breath for 3-5 seconds or for as long as possible. Allow the piston or ball to fall to the bottom of the column. 7. Remove the mouthpiece from your mouth and breathe out normally. 8. Rest for a few seconds and repeat Steps 1 through 7 at least 10 times every 1-2 hours when you are awake. Take your time and take a few normal breaths between deep breaths. 9. The spirometer may include an indicator to show your best effort. Use the indicator as a goal to work toward during each repetition. 10. After each set of 10 deep breaths, practice coughing to be sure your lungs are clear. If you have an incision (the cut made at the time of surgery), support your incision when coughing by placing a pillow or rolled up towels firmly against it. Once you are able to get out of bed, walk around indoors and cough well. You may stop using the incentive spirometer when instructed by your caregiver.  RISKS AND  COMPLICATIONS  Take your time so you do not get dizzy or light-headed.  If you are in pain, you may need to take or ask for pain medication before doing incentive spirometry. It is harder to take a deep breath if you are having pain. AFTER USE  Rest and breathe slowly and easily.  It can be helpful to keep track of a log of your progress. Your caregiver can provide you with a simple table to help with this. If you are using the spirometer at home, follow these instructions: Golden IF:   You are having difficultly using the spirometer.  You have trouble using the spirometer as often as instructed.  Your pain medication is not giving enough relief while using the spirometer.  You develop fever of 100.5 F (38.1 C) or higher. SEEK IMMEDIATE MEDICAL CARE IF:   You cough up bloody sputum that had not been present before.  You develop fever of 102 F (38.9 C) or greater.  You develop worsening pain at or near the incision site. MAKE SURE YOU:   Understand these instructions.  Will watch your condition.  Will get help right away if you are not doing well or get worse. Document Released: 11/15/2006 Document Revised: 09/27/2011 Document Reviewed: 01/16/2007 [facility] Patient Information 2014 [place], [place].   ________________________________________________________________________

## 2019-06-22 ENCOUNTER — Encounter (HOSPITAL_COMMUNITY): Payer: Self-pay

## 2019-06-22 ENCOUNTER — Other Ambulatory Visit (HOSPITAL_COMMUNITY)
Admission: RE | Admit: 2019-06-22 | Discharge: 2019-06-22 | Disposition: A | Discharge status: Home / Self Care | Payer: Medicare Other | Source: Ambulatory Visit | Attending: Orthopedic Surgery | Admitting: Orthopedic Surgery

## 2019-06-22 ENCOUNTER — Other Ambulatory Visit: Payer: Self-pay

## 2019-06-22 ENCOUNTER — Encounter (HOSPITAL_COMMUNITY)
Admission: RE | Admit: 2019-06-22 | Discharge: 2019-06-22 | Disposition: A | Discharge status: Home / Self Care | Payer: Medicare Other | Source: Ambulatory Visit | Attending: Orthopedic Surgery | Admitting: Orthopedic Surgery

## 2019-06-22 DIAGNOSIS — Z20828 Contact with and (suspected) exposure to other viral communicable diseases: Secondary | ICD-10-CM | POA: Diagnosis not present

## 2019-06-22 DIAGNOSIS — Z01812 Encounter for preprocedural laboratory examination: Secondary | ICD-10-CM | POA: Diagnosis not present

## 2019-06-22 DIAGNOSIS — M19012 Primary osteoarthritis, left shoulder: Secondary | ICD-10-CM | POA: Diagnosis not present

## 2019-06-22 LAB — BASIC METABOLIC PANEL
Anion gap: 10 (ref 5–15)
BUN: 29 mg/dL — ABNORMAL HIGH (ref 8–23)
CO2: 22 mmol/L (ref 22–32)
Calcium: 10.1 mg/dL (ref 8.9–10.3)
Chloride: 109 mmol/L (ref 98–111)
Creatinine, Ser: 1.11 mg/dL (ref 0.61–1.24)
GFR calc Af Amer: >60 mL/min (ref >60)
GFR calc non Af Amer: >60 mL/min (ref >60)
Glucose, Bld: 141 mg/dL — ABNORMAL HIGH (ref 70–99)
Potassium: 4.9 mmol/L (ref 3.5–5.1)
Sodium: 141 mmol/L (ref 135–145)

## 2019-06-22 LAB — GLUCOSE, CAPILLARY: Glucose-Capillary: 140 mg/dL — ABNORMAL HIGH (ref 70–99)

## 2019-06-22 LAB — CBC
HCT: 37.3 % — ABNORMAL LOW (ref 39.0–52.0)
Hemoglobin: 12 g/dL — ABNORMAL LOW (ref 13.0–17.0)
MCH: 29.8 pg (ref 26.0–34.0)
MCHC: 32.2 g/dL (ref 30.0–36.0)
MCV: 92.6 fL (ref 80.0–100.0)
Platelets: 201 10*3/uL (ref 150–400)
RBC: 4.03 MIL/uL — ABNORMAL LOW (ref 4.22–5.81)
RDW: 13.2 % (ref 11.5–15.5)
WBC: 6.1 10*3/uL (ref 4.0–10.5)
nRBC: 0 % (ref 0.0–0.2)

## 2019-06-22 LAB — HEMOGLOBIN A1C
Hgb A1c MFr Bld: 6.5 % — ABNORMAL HIGH (ref 4.8–5.6)
Mean Plasma Glucose: 139.85 mg/dL

## 2019-06-22 LAB — SURGICAL PCR SCREEN
MRSA, PCR: NEGATIVE
Staphylococcus aureus: NEGATIVE

## 2019-06-22 NOTE — Progress Notes (Addendum)
PCP -Dr. Jerene Bears  Cardiologist - N/A  Chest x-ray - N/A EKG - 02/05/2019 Stress Test - N/A ECHO - N/A Cardiac Cath - N/A  Sleep Study -N/A  CPAP - N/A  Fasting Blood Sugar -130-140  Checks Blood Sugar __1___ times a week if that  Blood Thinner Instructions:N/A Aspirin Instructions:Aspirin 81 mg prescribed for preventive treatment. Per Dr. Woody Seller -to stop iton Monday 06/18/2019 Last Dose:06/18/2019  Anesthesia review:   Chart given to Konrad Felix, PA to review lab results.   Patient has a history of heart murmur, HTN and DM.  Patient denies shortness of breath, fever, cough and chest pain at PAT appointment   Patient verbalized understanding of instructions that were given to them at the PAT appointment. Patient was also instructed that they will need to review over the PAT instructions again at home before surgery.

## 2019-06-23 LAB — NOVEL CORONAVIRUS, NAA (HOSP ORDER, SEND-OUT TO REF LAB; TAT 18-24 HRS): SARS-CoV-2, NAA: NOT DETECTED

## 2019-06-25 NOTE — Progress Notes (Addendum)
Talked to patient's wife, Katharine Look  And gave her instructions for patient to arrive at Admitting at 0940 am on 06/26/2019 and to drink the Gatorade G 2 drink at 0910 am and nothing after gatorade until after surgery! Wife verbalized understanding.

## 2019-06-25 NOTE — Progress Notes (Signed)
Spoke with pt in regards to arrival time of 0850 to [facility] admitting for scheduled procedure on Tuesday 06/26/2019. Pt verbalized understanding of no food after midnight; clear liquids from midnight to 0800 including consuming gatorade G2 by 0800.

## 2019-06-26 ENCOUNTER — Inpatient Hospital Stay (HOSPITAL_COMMUNITY): Payer: Medicare Other | Admitting: Anesthesiology

## 2019-06-26 ENCOUNTER — Inpatient Hospital Stay (HOSPITAL_COMMUNITY): Payer: Medicare Other | Admitting: Physician Assistant

## 2019-06-26 ENCOUNTER — Encounter (HOSPITAL_COMMUNITY): Payer: Self-pay | Admitting: Emergency Medicine

## 2019-06-26 ENCOUNTER — Telehealth (HOSPITAL_COMMUNITY): Payer: Self-pay | Admitting: *Deleted

## 2019-06-26 ENCOUNTER — Inpatient Hospital Stay (HOSPITAL_COMMUNITY)
Admission: RE | Admit: 2019-06-26 | Discharge: 2019-06-27 | DRG: 483 | Disposition: A | Discharge status: Home / Self Care | Payer: Medicare Other | Attending: Orthopedic Surgery | Admitting: Orthopedic Surgery

## 2019-06-26 ENCOUNTER — Encounter (HOSPITAL_COMMUNITY)
Admission: RE | Disposition: A | Discharge status: Home / Self Care | Payer: Self-pay | Source: Home / Self Care | Attending: Orthopedic Surgery

## 2019-06-26 ENCOUNTER — Other Ambulatory Visit: Payer: Self-pay

## 2019-06-26 DIAGNOSIS — I10 Essential (primary) hypertension: Secondary | ICD-10-CM | POA: Diagnosis not present

## 2019-06-26 DIAGNOSIS — M66312 Spontaneous rupture of flexor tendons, left shoulder: Secondary | ICD-10-CM | POA: Diagnosis present

## 2019-06-26 DIAGNOSIS — Z885 Allergy status to narcotic agent status: Secondary | ICD-10-CM | POA: Diagnosis not present

## 2019-06-26 DIAGNOSIS — M19012 Primary osteoarthritis, left shoulder: Principal | ICD-10-CM | POA: Diagnosis present

## 2019-06-26 DIAGNOSIS — Z87442 Personal history of urinary calculi: Secondary | ICD-10-CM | POA: Diagnosis not present

## 2019-06-26 DIAGNOSIS — Z96612 Presence of left artificial shoulder joint: Secondary | ICD-10-CM

## 2019-06-26 DIAGNOSIS — G8918 Other acute postprocedural pain: Secondary | ICD-10-CM | POA: Diagnosis not present

## 2019-06-26 DIAGNOSIS — Z981 Arthrodesis status: Secondary | ICD-10-CM | POA: Diagnosis not present

## 2019-06-26 DIAGNOSIS — F17220 Nicotine dependence, chewing tobacco, uncomplicated: Secondary | ICD-10-CM | POA: Diagnosis not present

## 2019-06-26 DIAGNOSIS — M67814 Other specified disorders of tendon, left shoulder: Secondary | ICD-10-CM | POA: Diagnosis present

## 2019-06-26 DIAGNOSIS — M75102 Unspecified rotator cuff tear or rupture of left shoulder, not specified as traumatic: Secondary | ICD-10-CM | POA: Diagnosis not present

## 2019-06-26 HISTORY — PX: REVERSE SHOULDER ARTHROPLASTY: SHX5054

## 2019-06-26 LAB — GLUCOSE, CAPILLARY
Glucose-Capillary: 138 mg/dL — ABNORMAL HIGH (ref 70–99)
Glucose-Capillary: 134 mg/dL — ABNORMAL HIGH (ref 70–99)

## 2019-06-26 SURGERY — ARTHROPLASTY, SHOULDER, TOTAL, REVERSE
Anesthesia: General | Site: Shoulder | Laterality: Left

## 2019-06-26 MED ORDER — DEXAMETHASONE SODIUM PHOSPHATE 4 MG/ML IJ SOLN
INTRAMUSCULAR | Status: DC | PRN
  Administered 2019-06-26: 8 mg via INTRAVENOUS

## 2019-06-26 MED ORDER — FENTANYL CITRATE (PF) 100 MCG/2ML IJ SOLN
INTRAMUSCULAR | Status: DC | PRN
  Administered 2019-06-26 (×3): 50 ug via INTRAVENOUS

## 2019-06-26 MED ORDER — METHOCARBAMOL 500 MG IVPB - SIMPLE MED
500.0000 mg | Freq: Four times a day (QID) | INTRAVENOUS | Status: DC | PRN
  Filled 2019-06-26: qty 50

## 2019-06-26 MED ORDER — PHENOL 1.4 % MT LIQD: 1.0000 | OROMUCOSAL | Status: DC | PRN

## 2019-06-26 MED ORDER — ROCURONIUM BROMIDE 50 MG/5ML IV SOSY
PREFILLED_SYRINGE | INTRAVENOUS | Status: DC | PRN
  Administered 2019-06-26: 50 mg via INTRAVENOUS
  Administered 2019-06-26: 10 mg via INTRAVENOUS

## 2019-06-26 MED ORDER — MIDAZOLAM HCL 2 MG/2ML IJ SOLN
1.0000 mg | INTRAMUSCULAR | Status: DC
  Administered 2019-06-26: 0.5 mg via INTRAVENOUS
  Filled 2019-06-26: qty 2

## 2019-06-26 MED ORDER — ONDANSETRON HCL 4 MG/2ML IJ SOLN
INTRAMUSCULAR | Status: DC | PRN
  Administered 2019-06-26: 4 mg via INTRAVENOUS

## 2019-06-26 MED ORDER — DIPHENHYDRAMINE HCL 12.5 MG/5ML PO ELIX: 12.5000 mg | ORAL_SOLUTION | ORAL | Status: DC | PRN

## 2019-06-26 MED ORDER — OXYCODONE HCL 5 MG PO TABS: 5.0000 mg | ORAL_TABLET | ORAL | Status: DC | PRN

## 2019-06-26 MED ORDER — DEXAMETHASONE SODIUM PHOSPHATE 10 MG/ML IJ SOLN
INTRAMUSCULAR | Status: AC
  Filled 2019-06-26: qty 1

## 2019-06-26 MED ORDER — LACTATED RINGERS IV SOLN
INTRAVENOUS | Status: DC
  Administered 2019-06-26: 22:00:00 via INTRAVENOUS

## 2019-06-26 MED ORDER — DOCUSATE SODIUM 100 MG PO CAPS
100.0000 mg | ORAL_CAPSULE | Freq: Two times a day (BID) | ORAL | Status: DC
  Administered 2019-06-26 – 2019-06-27 (×2): 100 mg via ORAL
  Filled 2019-06-26 (×2): qty 1

## 2019-06-26 MED ORDER — ACETAMINOPHEN 325 MG PO TABS: 325.0000 mg | ORAL_TABLET | Freq: Four times a day (QID) | ORAL | Status: DC | PRN

## 2019-06-26 MED ORDER — METOCLOPRAMIDE HCL 5 MG PO TABS: 5.0000 mg | ORAL_TABLET | Freq: Three times a day (TID) | ORAL | Status: DC | PRN

## 2019-06-26 MED ORDER — METHOCARBAMOL 500 MG PO TABS
500.0000 mg | ORAL_TABLET | Freq: Four times a day (QID) | ORAL | Status: DC | PRN
  Administered 2019-06-26: 500 mg via ORAL
  Filled 2019-06-26: qty 1

## 2019-06-26 MED ORDER — BISACODYL 5 MG PO TBEC: 5.0000 mg | DELAYED_RELEASE_TABLET | Freq: Every day | ORAL | Status: DC | PRN

## 2019-06-26 MED ORDER — EPHEDRINE 5 MG/ML INJ
INTRAVENOUS | Status: AC
  Filled 2019-06-26: qty 10

## 2019-06-26 MED ORDER — CEFAZOLIN SODIUM-DEXTROSE 2-4 GM/100ML-% IV SOLN
2.0000 g | INTRAVENOUS | Status: AC
  Administered 2019-06-26: 2 g via INTRAVENOUS
  Filled 2019-06-26: qty 100

## 2019-06-26 MED ORDER — MENTHOL 3 MG MT LOZG: 1.0000 | LOZENGE | OROMUCOSAL | Status: DC | PRN

## 2019-06-26 MED ORDER — PHENYLEPHRINE HCL-NACL 10-0.9 MG/250ML-% IV SOLN
INTRAVENOUS | Status: DC | PRN
  Administered 2019-06-26: 50 ug/min via INTRAVENOUS

## 2019-06-26 MED ORDER — EPHEDRINE SULFATE 50 MG/ML IJ SOLN
INTRAMUSCULAR | Status: DC | PRN
  Administered 2019-06-26: 5 mg via INTRAVENOUS

## 2019-06-26 MED ORDER — LOSARTAN POTASSIUM 50 MG PO TABS
50.0000 mg | ORAL_TABLET | Freq: Every day | ORAL | Status: DC
  Administered 2019-06-26: 50 mg via ORAL
  Filled 2019-06-26: qty 1

## 2019-06-26 MED ORDER — KETOROLAC TROMETHAMINE 15 MG/ML IJ SOLN
7.5000 mg | Freq: Four times a day (QID) | INTRAMUSCULAR | Status: DC
  Administered 2019-06-26 – 2019-06-27 (×3): 7.5 mg via INTRAVENOUS
  Filled 2019-06-26 (×3): qty 1

## 2019-06-26 MED ORDER — FENTANYL CITRATE (PF) 100 MCG/2ML IJ SOLN
50.0000 ug | INTRAMUSCULAR | Status: DC
  Administered 2019-06-26: 50 ug via INTRAVENOUS
  Filled 2019-06-26: qty 2

## 2019-06-26 MED ORDER — PANTOPRAZOLE SODIUM 40 MG PO TBEC
40.0000 mg | DELAYED_RELEASE_TABLET | Freq: Every day | ORAL | Status: DC
  Administered 2019-06-26 – 2019-06-27 (×2): 40 mg via ORAL
  Filled 2019-06-26 (×2): qty 1

## 2019-06-26 MED ORDER — SODIUM CHLORIDE 0.9 % IR SOLN
Status: DC | PRN
  Administered 2019-06-26: 1000 mL

## 2019-06-26 MED ORDER — PROPOFOL 10 MG/ML IV BOLUS
INTRAVENOUS | Status: DC | PRN
  Administered 2019-06-26: 120 mg via INTRAVENOUS
  Administered 2019-06-26: 50 mg via INTRAVENOUS

## 2019-06-26 MED ORDER — ALUM & MAG HYDROXIDE-SIMETH 200-200-20 MG/5ML PO SUSP: 30.0000 mL | ORAL | Status: DC | PRN

## 2019-06-26 MED ORDER — FENTANYL CITRATE (PF) 100 MCG/2ML IJ SOLN
INTRAMUSCULAR | Status: AC
  Filled 2019-06-26: qty 2

## 2019-06-26 MED ORDER — METOCLOPRAMIDE HCL 5 MG/ML IJ SOLN: 5.0000 mg | Freq: Three times a day (TID) | INTRAMUSCULAR | Status: DC | PRN

## 2019-06-26 MED ORDER — ONDANSETRON HCL 4 MG PO TABS: 4.0000 mg | ORAL_TABLET | Freq: Four times a day (QID) | ORAL | Status: DC | PRN

## 2019-06-26 MED ORDER — LACTATED RINGERS IV SOLN
INTRAVENOUS | Status: DC
  Administered 2019-06-26: 10:00:00 via INTRAVENOUS

## 2019-06-26 MED ORDER — PHENYLEPHRINE HCL (PRESSORS) 10 MG/ML IV SOLN
INTRAVENOUS | Status: AC
  Filled 2019-06-26: qty 1

## 2019-06-26 MED ORDER — MAGNESIUM CITRATE PO SOLN: 1.0000 | Freq: Once | ORAL | Status: DC | PRN

## 2019-06-26 MED ORDER — PROPOFOL 10 MG/ML IV BOLUS
INTRAVENOUS | Status: AC
  Filled 2019-06-26: qty 20

## 2019-06-26 MED ORDER — ONDANSETRON HCL 4 MG/2ML IJ SOLN
INTRAMUSCULAR | Status: AC
  Filled 2019-06-26: qty 2

## 2019-06-26 MED ORDER — FENTANYL CITRATE (PF) 100 MCG/2ML IJ SOLN: 25.0000 ug | INTRAMUSCULAR | Status: DC | PRN

## 2019-06-26 MED ORDER — ONDANSETRON HCL 4 MG/2ML IJ SOLN: 4.0000 mg | Freq: Four times a day (QID) | INTRAMUSCULAR | Status: DC | PRN

## 2019-06-26 MED ORDER — CHLORHEXIDINE GLUCONATE 4 % EX LIQD: 60.0000 mL | Freq: Once | CUTANEOUS | Status: DC

## 2019-06-26 MED ORDER — TRANEXAMIC ACID-NACL 1000-0.7 MG/100ML-% IV SOLN
1000.0000 mg | INTRAVENOUS | Status: AC
  Administered 2019-06-26: 1000 mg via INTRAVENOUS
  Filled 2019-06-26: qty 100

## 2019-06-26 MED ORDER — POLYETHYLENE GLYCOL 3350 17 G PO PACK: 17.0000 g | PACK | Freq: Every day | ORAL | Status: DC | PRN

## 2019-06-26 MED ORDER — ONDANSETRON HCL 4 MG/2ML IJ SOLN: 4.0000 mg | Freq: Once | INTRAMUSCULAR | Status: DC | PRN

## 2019-06-26 MED ORDER — HYDROMORPHONE HCL 1 MG/ML IJ SOLN: 0.5000 mg | INTRAMUSCULAR | Status: DC | PRN

## 2019-06-26 MED ORDER — OXYCODONE HCL 5 MG PO TABS: 10.0000 mg | ORAL_TABLET | ORAL | Status: DC | PRN

## 2019-06-26 MED ORDER — ROCURONIUM BROMIDE 10 MG/ML (PF) SYRINGE
PREFILLED_SYRINGE | INTRAVENOUS | Status: AC
  Filled 2019-06-26: qty 10

## 2019-06-26 SURGICAL SUPPLY — 68 items
BAG ZIPLOCK 12X15 (MISCELLANEOUS) ×2 IMPLANT
BLADE SAW SGTL 83.5X18.5 (BLADE) ×2 IMPLANT
COOLER ICEMAN CLASSIC (MISCELLANEOUS) ×2 IMPLANT
COVER BACK TABLE 60X90IN (DRAPES) ×2 IMPLANT
COVER SURGICAL LIGHT HANDLE (MISCELLANEOUS) ×2 IMPLANT
COVER WAND RF STERILE (DRAPES) IMPLANT
CUP SUT UNIV REVERS 39 NEU (Shoulder) ×2 IMPLANT
DERMABOND ADVANCED (GAUZE/BANDAGES/DRESSINGS) ×1
DERMABOND ADVANCED .7 DNX12 (GAUZE/BANDAGES/DRESSINGS) ×1 IMPLANT
DRAPE INCISE IOBAN 66X45 STRL (DRAPES) IMPLANT
DRAPE ORTHO SPLIT 77X108 STRL (DRAPES) ×2
DRAPE SHEET LG 3/4 BI-LAMINATE (DRAPES) ×2 IMPLANT
DRAPE SURG 17X11 SM STRL (DRAPES) ×2 IMPLANT
DRAPE SURG ORHT 6 SPLT 77X108 (DRAPES) ×2 IMPLANT
DRAPE U-SHAPE 47X51 STRL (DRAPES) ×2 IMPLANT
DRESSING AQUACEL AG SP 3.5X10 (GAUZE/BANDAGES/DRESSINGS) ×1 IMPLANT
DRSG AQUACEL AG ADV 3.5X10 (GAUZE/BANDAGES/DRESSINGS) ×2 IMPLANT
DRSG AQUACEL AG SP 3.5X10 (GAUZE/BANDAGES/DRESSINGS) ×2
DURAPREP 26ML APPLICATOR (WOUND CARE) ×2 IMPLANT
ELECT BLADE TIP CTD 4 INCH (ELECTRODE) ×2 IMPLANT
ELECT REM PT RETURN 15FT ADLT (MISCELLANEOUS) ×2 IMPLANT
FACESHIELD WRAPAROUND (MASK) ×8 IMPLANT
GLENOID UNI REV MOD 24 +2 LAT (Joint) ×2 IMPLANT
GLENOSPHERE 39+4 LAT/24 UNI RV (Joint) ×2 IMPLANT
GLOVE BIO SURGEON STRL SZ7.5 (GLOVE) ×2 IMPLANT
GLOVE BIO SURGEON STRL SZ8 (GLOVE) ×2 IMPLANT
GLOVE SS BIOGEL STRL SZ 7 (GLOVE) ×1 IMPLANT
GLOVE SS BIOGEL STRL SZ 7.5 (GLOVE) ×1 IMPLANT
GLOVE SUPERSENSE BIOGEL SZ 7 (GLOVE) ×1
GLOVE SUPERSENSE BIOGEL SZ 7.5 (GLOVE) ×1
GLOVE SURG SYN 7.0 (GLOVE) ×2 IMPLANT
GLOVE SURG SYN 7.5  E (GLOVE) ×1
GLOVE SURG SYN 7.5 E (GLOVE) ×1 IMPLANT
GLOVE SURG SYN 8.0 (GLOVE) ×2 IMPLANT
GOWN STRL REUS W/TWL LRG LVL3 (GOWN DISPOSABLE) ×4 IMPLANT
INSERT HUMERAL 39/+6 (Insert) ×2 IMPLANT
KIT BASIN OR (CUSTOM PROCEDURE TRAY) ×2 IMPLANT
KIT TURNOVER KIT A (KITS) IMPLANT
MANIFOLD NEPTUNE II (INSTRUMENTS) ×2 IMPLANT
NEEDLE TAPERED W/ NITINOL LOOP (MISCELLANEOUS) ×2 IMPLANT
NS IRRIG 1000ML POUR BTL (IV SOLUTION) ×2 IMPLANT
PACK SHOULDER (CUSTOM PROCEDURE TRAY) ×2 IMPLANT
PAD ARMBOARD 7.5X6 YLW CONV (MISCELLANEOUS) ×2 IMPLANT
PAD COLD SHLDR WRAP-ON (PAD) ×2 IMPLANT
PENCIL SMOKE EVACUATOR (MISCELLANEOUS) IMPLANT
RESTRAINT HEAD UNIVERSAL NS (MISCELLANEOUS) ×2 IMPLANT
SCREW CENTRAL MOD 30MM (Screw) ×2 IMPLANT
SCREW PERI LOCK 5.5X16 (Screw) ×2 IMPLANT
SCREW PERI LOCK 5.5X24 (Screw) ×4 IMPLANT
SCREW PERI LOCK 5.5X36 (Screw) ×2 IMPLANT
SLING ARM FOAM STRAP LRG (SOFTGOODS) ×2 IMPLANT
SLING ARM FOAM STRAP MED (SOFTGOODS) IMPLANT
SPONGE LAP 18X18 RF (DISPOSABLE) IMPLANT
STEM HUMERAL UNI REVERS SZ9 (Stem) ×2 IMPLANT
SUCTION FRAZIER HANDLE 12FR (TUBING) ×1
SUCTION TUBE FRAZIER 12FR DISP (TUBING) ×1 IMPLANT
SUT FIBERWIRE #2 38 T-5 BLUE (SUTURE) ×4
SUT MNCRL AB 3-0 PS2 18 (SUTURE) ×2 IMPLANT
SUT MON AB 2-0 CT1 36 (SUTURE) ×2 IMPLANT
SUT VIC AB 1 CT1 36 (SUTURE) ×2 IMPLANT
SUT VIC AB 2-0 CT2 27 (SUTURE) ×2 IMPLANT
SUTURE FIBERWR #2 38 T-5 BLUE (SUTURE) ×2 IMPLANT
SUTURE TAPE 1.3 40 TPR END (SUTURE) ×2 IMPLANT
SUTURETAPE 1.3 40 TPR END (SUTURE) ×4
TOWEL OR 17X26 10 PK STRL BLUE (TOWEL DISPOSABLE) ×2 IMPLANT
TOWEL OR NON WOVEN STRL DISP B (DISPOSABLE) ×2 IMPLANT
WATER STERILE IRR 1000ML POUR (IV SOLUTION) ×4 IMPLANT
YANKAUER SUCT BULB TIP 10FT TU (MISCELLANEOUS) ×2 IMPLANT

## 2019-06-26 NOTE — Transfer of Care (Signed)
Immediate Anesthesia Transfer of Care Note  Patient: Julian Dominguez  Procedure(s) Performed: Procedure(s) with comments: REVERSE SHOULDER ARTHROPLASTY (Left) - 167min  Patient Location: PACU  Anesthesia Type:General and Regional  Level of Consciousness: Patient easily awoken, sedated, comfortable, cooperative, following commands, responds to stimulation.   Airway & Oxygen Therapy: Patient spontaneously breathing, ventilating well, oxygen via simple oxygen mask.  Post-op Assessment: Report given to PACU RN, vital signs reviewed and stable, moving all extremities.   Post vital signs: Reviewed and stable.  Complications: No apparent anesthesia complications  Last Vitals:  Vitals Value Taken Time  BP 164/91 06/26/19 1352  Temp    Pulse 69 06/26/19 1354  Resp 17 06/26/19 1354  SpO2 100 % 06/26/19 1354  Vitals shown include unvalidated device data.  Last Pain:  Vitals:   06/26/19 1055  TempSrc:   PainSc: 0-No pain      Patients Stated Pain Goal: 4 ([id number])  Complications: No apparent anesthesia complications

## 2019-06-26 NOTE — Plan of Care (Signed)

## 2019-06-26 NOTE — Discharge Instructions (Signed)
° °Kevin M. Supple, M.D., F.A.A.O.S. °Orthopaedic Surgery °Specializing in Arthroscopic and Reconstructive °Surgery of the Shoulder °[phone] °[address] - [place], [place] [address] - Fax [phone] ° ° °POST-OP TOTAL SHOULDER REPLACEMENT INSTRUCTIONS ° °1. Call the office at [phone] to schedule your first post-op appointment 10-14 days from the date of your surgery. ° °2. The bandage over your incision is waterproof. You may begin showering with this dressing on. You may leave this dressing on until first follow up appointment within 2 weeks. We prefer you leave this dressing in place until follow up however after 5-7 days if you are having itching or skin irritation and would like to remove it you may do so. Go slow and tug at the borders gently to break the bond the dressing has with the skin. At this point if there is no drainage it is okay to go without a bandage or you may cover it with a light guaze and tape. You can also expect significant bruising around your shoulder that will drift down your arm and into your chest wall. This is very normal and should resolve over several days. ° ° 3. Wear your sling/immobilizer at all times except to perform the exercises below or to occasionally let your arm dangle by your side to stretch your elbow. You also need to sleep in your sling immobilizer until instructed otherwise. It is ok to remove your sling if you are sitting in a controlled environment and allow your arm to rest in a position of comfort by your side or on your lap with pillows to give your neck and skin a break from the sling. You may remove it to allow arm to dangle by side to shower. If you are up walking around and when you go to sleep at night you need to wear it. ° °4. Range of motion to your elbow, wrist, and hand are encouraged 3-5 times daily. Exercise to your hand and fingers helps to reduce swelling you may experience. ° °5. Utilize ice to the shoulder 3-5 times  minimum a day and additionally if you are experiencing pain. ° °6. Prescriptions for a pain medication and a muscle relaxant are provided for you. It is recommended that if you are experiencing pain that you pain medication alone is not controlling, add the muscle relaxant along with the pain medication which can give additional pain relief. The first 1-2 days is generally the most severe of your pain and then should gradually decrease. As your pain lessens it is recommended that you decrease your use of the pain medications to an "as needed basis'" only and to always comply with the recommended dosages of the pain medications. ° °7. Pain medications can produce constipation along with their use. If you experience this, the use of an over the counter stool softener or laxative daily is recommended.  ° °8. For additional questions or concerns, please do not hesitate to call the office. If after hours there is an answering service to forward your concerns to the physician on call. ° °9.Pain control following an exparel block ° °To help control your post-operative pain you received a nerve block  performed with Exparel which is a long acting anesthetic (numbing agent) which can provide pain relief and sensations of numbness (and relief of pain) in the operative shoulder and arm for up to 3 days. Sometimes it provides mixed relief, meaning you may still have numbness in certain areas of the arm but can still   be able to move  parts of that arm, hand, and fingers. We recommend that your prescribed pain medications  be used as needed. We do not feel it is necessary to "pre medicate" and "stay ahead" of pain.  Taking narcotic pain medications when you are not having any pain can lead to unnecessary and potentially dangerous side effects.    10. Use the ice machine as much as possible in the first 5-7 days from surgery, then you can wean its use to as needed. The ice typically needs to be replaced every 6 hours, instead of  ice you can actually freeze water bottles to put in the cooler and then fill water around them to avoid having to purchase ice. You can have spare water bottles freezing to allow you to rotate them once they have melted. Try to have a thin shirt or light cloth or towel under the ice wrap to protect your skin.   11.  We recommend that you avoid any dental work or cleaning in the first 3 months following your joint replacement. This is to help minimize the possibility of infection from the bacteria in your mouth that enters your bloodstream during dental work. We also recommend that you take an antibiotic prior to your dental work for the first year after your shoulder replacement to further help reduce that risk. Please simply contact our office for antibiotics to be sent to your pharmacy prior to dental work.  POST-OP EXERCISES  Pendulum Exercises  Perform pendulum exercises while standing and bending at the waist. Support your uninvolved arm on a table or chair and allow your operated arm to hang freely. Make sure to do these exercises passively - not using you shoulder muscles. These exercises can be performed once your nerve block effects have worn off.  Repeat 20 times. Do 3 sessions per day.

## 2019-06-26 NOTE — Anesthesia Procedure Notes (Signed)
Procedure Name: Intubation Date/Time: 06/26/2019 12:01 PM Performed by: Deliah Boston, CRNA Pre-anesthesia Checklist: Patient identified, Emergency Drugs available, Suction available and Patient being monitored Patient Re-evaluated:Patient Re-evaluated prior to induction Oxygen Delivery Method: Circle system utilized Preoxygenation: Pre-oxygenation with 100% oxygen Induction Type: IV induction Ventilation: Mask ventilation without difficulty Laryngoscope Size: Mac and 4 Grade View: Grade I Tube type: Oral Tube size: 7.5 mm Number of attempts: 1 Airway Equipment and Method: Stylet and Oral airway Placement Confirmation: ETT inserted through vocal cords under direct vision,  positive ETCO2 and breath sounds checked- equal and bilateral Secured at: 21 cm Tube secured with: Tape Dental Injury: Teeth and Oropharynx as per pre-operative assessment

## 2019-06-26 NOTE — Op Note (Signed)
06/26/2019  1:40 PM  PATIENT:   Julian Dominguez  78 y.o. male  PRE-OPERATIVE DIAGNOSIS:  Left Shoulder osteoarthritis with profoundly restricted mobility and associated severe rotator cuff dysfunction  POST-OPERATIVE DIAGNOSIS: Same  PROCEDURE: Left shoulder reverse arthroplasty utilizing a press-fit size 9 Arthrex stem with a +6 polyethylene spacer, 39/+4 glenosphere on a small/+2 baseplate  SURGEON:  Tavaras Goody, Metta Clines M.D.  ASSISTANTS: Jenetta Loges, PA-C  ANESTHESIA:   General endotracheal and interscalene block with Exparel  EBL: 200 cc  SPECIMEN: None  Drains: None   PATIENT DISPOSITION:  PACU - hemodynamically stable.    PLAN OF CARE: Admit for overnight observation  Brief history:  Julian Dominguez is a 78 year old gentleman who has had chronic and progressively increasing left shoulder pain related to end stage osteoarthritis with severe bony deformity and profoundly restricted mobility.  His right shoulder also has end stage osteoarthritis but the left shoulder is much more symptomatic.  He is brought to the operating this time for planned left reverse shoulder arthroplasty  Preoperatively I counseled Mr. Ice regarding treatment options as well as the potential risks versus benefits thereof.  Possible surgical complications were reviewed including bleeding, infection, neurovascular injury, persistent pain, loss of motion, anesthetic complication, failure the implant, and possible need for additional surgery.  He understands, and accepts, and agrees with our planned procedure.  Procedure in detail:  After undergoing routine preop evaluation patient received prophylactic antibiotics and interscalene block with Exparel was established in the holding area by the anesthesia department.  Placed supine on the operating table and underwent the smooth induction of a general endotracheal anesthesia.  Placed into the beachchair position and appropriately padded and protected.  The  left shoulder girdle region was sterilely prepped and draped in standard fashion.  Timeout was called.  An anterior deltopectoral approach left shoulder is made through a 10 cm incision.  Skin flaps were elevated dissection carried deeply and electrocautery was used for hemostasis.  The deltopectoral interval was then developed from proximal to distal with the vein taken laterally and adhesions were divided beneath the deltoid and the conjoined tendon was mobilized and retracted medially.  Clinically there had been previous rupture of the long head biceps tendon but the residual tendon was tenodesed at the upper border of the pectoralis major and the proximal segment was then unroofed and excised.  The subscapularis was then divided from its lesser tuberosity insertion and tagged with a pair of suture tape sutures and then reflected medially and the capsular attachments were divided from the anterior and inferior margins of the humeral neck allowing deliver the humeral head through the wound.  Extra medullary guide was then used to outline our proposed humeral head resection which was then performed at approximately 20 degrees retroversion with an oscillating saw and then a rondure was used to remove the osteophyte remnants from the margin of the humeral neck.  A metal cap was then placed over the cut proximal humeral surface and at this point we exposed the glenoid with appropriate retractors and gained complete visualization to perform a circumferential labral resection and once this was completed a guidepin was directed into the center of the glenoid with an approximately 10 degree inferior tilt and the glenoid was then reamed with our central followed by the peripheral reamer additionally correcting some mild retroversion.  Remaining osteophyte on the infra margin of the glenoid was then removed with a rondure.  The glenoid was then terminally prepared with the central drill  followed by the tap and our glenoid  was then assembled with a 30 mm lag screw and this was then inserted with excellent purchase and fixation.  The peripheral locking screws were all then placed using standard technique with good purchase and fixation.  A 39/+4 glenosphere was then impacted onto the baseplate and the central locking screw was then placed with good fixation.  We then returned our attention to the humeral metaphysis where the canal finder was used to open the humeral canal and then broached up to a size 9 with excellent fit and fixation at approximate 20 degrees retroversion.  A 6 neutral metaphyseal reamer was then used to prepare the metaphysis and at this point a trial was placed and this showed good soft tissue balance and good stability.  The final implant was then assembled on the back table up this was then impacted the humeral shaft with excellent fit and fixation.  Trial reductions were then performed and ultimately a +6 polygave [place] the soft tissue balance.  The trial was removed and the final +6 polywas then impacted into position.  Final reduction was performed again showing excellent soft tissue balance good motion and good stability.  At this point the subscapularis was then mobilized and with good elasticity confirmed it was repaired back to the collar of her implant utilizing the suture tape sutures.  This point the arm easily achieved 45 degrees of external rotation without excessive tension on the subscapularis repair.  The wound was again copiously irrigated.  Final hemostasis was obtained.  The deltopectoral interval was reapproximated with a series of figure-of-eight #1 Vicryl sutures.  2-0 Vicryl used for the subcu layer and intracuticular 3 Monocryl for the skin followed by Dermabond and Aquasol dressing the left arm was then placed in a sling and the patient was awakened, extubated, taken recovery in stable addition.  Jenetta Loges, PA-C was used as an Environmental consultant throughout this case essential for help with  positioning the patient, positioning extremity, tissue manipulation, implantation of the prosthesis, wound closure, and intraoperative decision-making.  Metta Clines Aryanne Gilleland MD     Contact # [phone]

## 2019-06-26 NOTE — Progress Notes (Signed)
Assisted Dr. Joslin with left, ultrasound guided, interscalene  block. Side rails up, monitors on throughout procedure. See vital signs in flow sheet. Tolerated Procedure well. 

## 2019-06-26 NOTE — H&P (Signed)
Julian Dominguez    Chief Complaint: Left Shoulder osteoarthritis HPI: The patient is a 78 y.o. male with chronic and progressively increasing bilateral shoulder pain and severely restricted mobility with left symptomatically worse than the right related to end stage osteoarthritis with associated severe bony deformity.  Patient is brought to the operating at this time for planned left shoulder reverse arthroplasty  Past Medical History:  Diagnosis Date  . Arthritis   . Cataract    removed bilatterally  . Diabetes mellitus   . Diverticulosis    at 04-28-11 colon   . Heart murmur    as a child  . History of kidney stones   . Hyperlipidemia    takes red yeast rice   . Hypertension   . Tubular adenoma of colon 04/28/2011    Past Surgical History:  Procedure Laterality Date  . [place]   hardware  . ANTERIOR LAT LUMBAR FUSION Left 02/13/2019   Procedure: Left side Lumbar two-three Lumbar three-four Lumbar four-five Anterolateral decompression, posterior pedicle screw fixation;  Surgeon: Julian Miss, MD;  Location: [place];  Service: Neurosurgery;  Laterality: Left;  . APPLICATION OF ROBOTIC ASSISTANCE FOR SPINAL PROCEDURE N/A 02/13/2019   Procedure: APPLICATION OF ROBOTIC ASSISTANCE FOR SPINAL PROCEDURE;  Surgeon: Julian Miss, MD;  Location: [place];  Service: Neurosurgery;  Laterality: N/A;  . CATARACT EXTRACTION, BILATERAL  2011  . CHOLECYSTECTOMY  1997  . COLONOSCOPY    . cyst removed from finger    . EYE SURGERY     bilateral cataract surgery with lens implants   . LUMBAR PERCUTANEOUS PEDICLE SCREW 3 LEVEL N/A 02/13/2019   Procedure: LUMBAR PERCUTANEOUS PEDICLE SCREW LUMBAR TWO TO LUMBAR FIVE;  Surgeon: Julian Miss, MD;  Location: [place];  Service: Neurosurgery;  Laterality: N/A;  . NECK SURGERY    . POLYPECTOMY      Family History  Problem Relation Age of Onset  . Colon cancer Father 69  . Stomach cancer Maternal Aunt   . Colon cancer Maternal  Uncle   . Stomach cancer Maternal Grandfather 64  . Colon cancer Cousin   . Colon cancer Cousin   . Colon polyps Neg Hx   . Rectal cancer Neg Hx     Social History:  reports that he quit smoking about 50 years ago. His smoking use included cigarettes. His smokeless tobacco use includes chew. He reports that he does not drink alcohol or use drugs.   Facility-Administered Medications Prior to Admission  Medication Dose Route Frequency Provider Last Rate Last Dose  . 0.9 %  sodium chloride infusion  500 mL Intravenous Continuous Julian Meuse III, MD       Medications Prior to Admission  Medication Sig Dispense Refill  . Ascorbic Acid (VITAMIN C) 1000 MG tablet Take 1,000 mg by mouth daily.      Marland Kitchen aspirin 81 MG chewable tablet Chew 81 mg by mouth daily.    . B Complex-C (SUPER B COMPLEX PO) Take 1 tablet by mouth daily.    Marland Kitchen BLACK ELDERBERRY PO Take 2 tablets by mouth daily.    . diclofenac (VOLTAREN) 75 MG EC tablet Take 75 mg by mouth 2 (two) times daily.    . Garlic 123XX123 MG CAPS Take 2,000 mg by mouth daily.     Marland Kitchen glimepiride (AMARYL) 2 MG tablet Take 2 mg by mouth daily with breakfast. Taking 1/2 in morning and 1/2 at dinner time    . losartan (COZAAR)  50 MG tablet Take 50 mg by mouth at bedtime.     . Multiple Vitamin (MULTIVITAMIN) tablet Take 1 tablet by mouth daily.      . vitamin E 400 UNIT capsule Take 800 Units by mouth daily.     Marland Kitchen ibuprofen (ADVIL) 200 MG tablet Take 400 mg by mouth every 6 (six) hours as needed for moderate pain.    . Omega-3 Fatty Acids (FISH OIL) 1000 MG CPDR Take 2,000 mg by mouth daily after breakfast.       Physical Exam: Left shoulder demonstrates painful and severely restricted mobility as noted at his recent office visits.  Plain radiographs confirm severe bony deformity with prominent peripheral osteophytes and subchondral sclerosis.  Vitals  Temp:  [98.9 F (37.2 C)] 98.9 F (37.2 C) (12/08 1001) Pulse Rate:  [57-83] 83 (12/08  1107) Resp:  [8-24] 14 (12/08 1107) BP: (143-168)/(77-115) 167/93 (12/08 1100) SpO2:  [98 %-100 %] 100 % (12/08 1107) Weight:  [93.8 kg] 93.8 kg (12/08 0945)  Assessment/Plan  Impression: Left Shoulder osteoarthritis with associated rotator cuff dysfunction   Plan of Action: Procedure(s): REVERSE SHOULDER ARTHROPLASTY  Julian Dominguez 06/26/2019, 11:14 AM Contact # [phone]

## 2019-06-26 NOTE — Anesthesia Procedure Notes (Signed)
Anesthesia Regional Block: Interscalene brachial plexus block   Pre-Anesthetic Checklist: ,, timeout performed, Correct Patient, Correct Site, Correct Laterality, Correct Procedure, Correct Position, site marked, Risks and benefits discussed,  Surgical consent,  Pre-op evaluation,  At surgeon's request and post-op pain management  Laterality: Left  Prep: chloraprep       Needles:  Injection technique: Single-shot  Needle Type: Stimulator Needle - 40      Needle Gauge: 22     Additional Needles:   Procedures:, nerve stimulator,,,,,,,  Narrative:  Start time: 06/26/2019 10:45 AM End time: 06/26/2019 10:55 AM Injection made incrementally with aspirations every 5 mL.  Performed by: Personally   Additional Notes: 20 cc 0.5% Bupivacaine with 1:200 epi  10 cc 1.3% Exparel injected easily

## 2019-06-26 NOTE — Anesthesia Preprocedure Evaluation (Addendum)
Anesthesia Evaluation  Patient identified by MRN, date of birth, ID band Patient awake    Reviewed: Allergy & Precautions, NPO status , Patient's Chart, lab work & pertinent test results  Airway Mallampati: II  TM Distance: >3 FB Neck ROM: Full    Dental  (+) Teeth Intact, Dental Advisory Given   Pulmonary former smoker,    breath sounds clear to auscultation       Cardiovascular hypertension,  Rhythm:Regular Rate:Normal     Neuro/Psych    GI/Hepatic   Endo/Other  diabetes  Renal/GU      Musculoskeletal   Abdominal   Peds  Hematology   Anesthesia Other Findings   Reproductive/Obstetrics                            Anesthesia Physical Anesthesia Plan  ASA: III  Anesthesia Plan: General   Post-op Pain Management:  Regional for Post-op pain   Induction:   PONV Risk Score and Plan: Ondansetron and Dexamethasone  Airway Management Planned: Oral ETT  Additional Equipment:   Intra-op Plan:   Post-operative Plan: Extubation in OR  Informed Consent: I have reviewed the patients History and Physical, chart, labs and discussed the procedure including the risks, benefits and alternatives for the proposed anesthesia with the patient or authorized representative who has indicated his/her understanding and acceptance.     Dental advisory given  Plan Discussed with: CRNA and Anesthesiologist  Anesthesia Plan Comments:        Anesthesia Quick Evaluation

## 2019-06-26 NOTE — Anesthesia Postprocedure Evaluation (Signed)
Anesthesia Post Note  Patient: Julian Dominguez  Procedure(s) Performed: REVERSE SHOULDER ARTHROPLASTY (Left Shoulder)     Patient location during evaluation: PACU Anesthesia Type: General Level of consciousness: awake and alert Pain management: pain level controlled Vital Signs Assessment: post-procedure vital signs reviewed and stable Respiratory status: spontaneous breathing, nonlabored ventilation, respiratory function stable and patient connected to nasal cannula oxygen Cardiovascular status: blood pressure returned to baseline and stable Postop Assessment: no apparent nausea or vomiting Anesthetic complications: no    Last Vitals:  Vitals:   06/26/19 1658 06/26/19 1813  BP: (!) 128/96 (!) 147/74  Pulse: 70 82  Resp: 18 16  Temp: 36.8 C 36.5 C  SpO2: 99% 100%    Last Pain:  Vitals:   06/26/19 1658  TempSrc: Oral  PainSc: 0-No pain                 Madie Cahn COKER

## 2019-06-27 MED ORDER — CYCLOBENZAPRINE HCL 10 MG PO TABS: 10.0000 mg | ORAL_TABLET | Freq: Three times a day (TID) | ORAL | 1 refills | Status: DC | PRN

## 2019-06-27 MED ORDER — OXYCODONE-ACETAMINOPHEN 5-325 MG PO TABS: 1.0000 | ORAL_TABLET | ORAL | 0 refills | Status: DC | PRN

## 2019-06-27 MED ORDER — ONDANSETRON HCL 4 MG PO TABS: 4.0000 mg | ORAL_TABLET | Freq: Three times a day (TID) | ORAL | 0 refills | Status: DC | PRN

## 2019-06-27 NOTE — Discharge Summary (Signed)
PATIENT ID:      Julian Dominguez  MRN:     [id number] DOB/AGE:    January 28, 1941 / 78 y.o.     DISCHARGE SUMMARY  ADMISSION DATE:    06/26/2019 DISCHARGE DATE:    ADMISSION DIAGNOSIS: Left Shoulder osteoarthritis Past Medical History:  Diagnosis Date  . Arthritis   . Cataract    removed bilatterally  . Diabetes mellitus   . Diverticulosis    at 04-28-11 colon   . Heart murmur    as a child  . History of kidney stones   . Hyperlipidemia    takes red yeast rice   . Hypertension   . Tubular adenoma of colon 04/28/2011    DISCHARGE DIAGNOSIS:   Active Problems:   S/P reverse total shoulder arthroplasty, left   PROCEDURE: Procedure(s): REVERSE SHOULDER ARTHROPLASTY on 06/26/2019  CONSULTS:    HISTORY:  See H&P in chart.  HOSPITAL COURSE:  Julian Dominguez is a 78 y.o. admitted on 06/26/2019 with a diagnosis of Left Shoulder osteoarthritis.  They were brought to the operating room on 06/26/2019 and underwent Procedure(s): [place].    They were given perioperative antibiotics:  Anti-infectives (From admission, onward)   Start     Dose/Rate Route Frequency Ordered Stop   06/26/19 0930  ceFAZolin (ANCEF) IVPB 2g/100 mL premix     2 g 200 mL/hr over 30 Minutes Intravenous On call to O.R. 06/26/19 [id number] 06/26/19 1202    .  Patient underwent the above named procedure and tolerated it well. The following day they were hemodynamically stable and pain was controlled on oral analgesics. They were neurovascularly intact to the operative extremity. OT was ordered and worked with patient per protocol. They were medically and orthopaedically stable for discharge on .    DIAGNOSTIC STUDIES:  RECENT RADIOGRAPHIC STUDIES :  No results found.  RECENT VITAL SIGNS:   Patient Vitals for the past 24 hrs:  BP Temp Temp src Pulse Resp SpO2 Height Weight  06/27/19 0541 117/67 (!) 97.5 F (36.4 C) - 71 17 97 % - -  06/27/19 0110 123/78 97.9 F (36.6 C) - 75 20 96  % - -  06/26/19 2205 (!) 143/93 98.2 F (36.8 C) - 99 18 96 % - -  06/26/19 1813 (!) 147/74 97.7 F (36.5 C) - 82 16 100 % - -  06/26/19 1658 (!) 128/96 98.2 F (36.8 C) Oral 70 18 99 % - -  06/26/19 1548 131/87 97.8 F (36.6 C) Oral 66 18 100 % - -  06/26/19 1500 137/73 - - (!) 59 10 100 % - -  06/26/19 1445 129/75 - - 65 12 100 % - -  06/26/19 1430 112/76 - - 62 12 100 % - -  06/26/19 1415 137/78 - - 70 12 100 % - -  06/26/19 1400 (!) 151/80 98 F (36.7 C) - 70 13 100 % - -  06/26/19 1352 (!) 164/91 - - 84 14 100 % - -  06/26/19 1144 - - - 65 10 96 % - -  06/26/19 1143 - - - 64 11 96 % - -  06/26/19 1142 - - - 63 10 98 % - -  06/26/19 1141 - - - 61 10 98 % - -  06/26/19 1140 137/71 - - 68 16 98 % - -  06/26/19 1139 - - - 71 12 98 % - -  06/26/19 1138 - - - 69 14 98 % - -  06/26/19 1137 - - - 66 10 96 % - -  06/26/19 1136 - - - 63 12 98 % - -  06/26/19 1135 (!) 149/79 - - 66 10 97 % - -  06/26/19 1134 - - - 62 12 98 % - -  06/26/19 1133 - - - 61 10 97 % - -  06/26/19 1132 - - - 62 10 97 % - -  06/26/19 1131 - - - 62 (!) 9 97 % - -  06/26/19 1130 135/84 - - 67 12 99 % - -  06/26/19 1129 - - - 72 12 98 % - -  06/26/19 1128 - - - 71 12 98 % - -  06/26/19 1127 - - - 69 17 98 % - -  06/26/19 1126 - - - 65 13 98 % - -  06/26/19 1125 (!) 143/81 - - 63 11 97 % - -  06/26/19 1124 - - - 62 11 97 % - -  06/26/19 1123 - - - 63 13 95 % - -  06/26/19 1122 - - - 64 10 98 % - -  06/26/19 1121 - - - 75 16 98 % - -  06/26/19 1120 (!) 144/78 - - 64 10 97 % - -  06/26/19 1119 - - - 63 10 97 % - -  06/26/19 1118 - - - 67 11 97 % - -  06/26/19 1117 - - - 64 13 98 % - -  06/26/19 1116 - - - 71 13 98 % - -  06/26/19 1115 - - - 77 17 96 % - -  06/26/19 1114 - - - 70 13 97 % - -  06/26/19 1113 - - - 66 13 96 % - -  06/26/19 1112 - - - 79 15 98 % - -  06/26/19 1111 - - - 65 13 97 % - -  06/26/19 1110 - - - 65 14 98 % - -  06/26/19 1109 - - - 66 13 99 % - -  06/26/19 1108 - - - 78 13 99 % -  -  06/26/19 1107 - - - 83 14 100 % - -  06/26/19 1102 - - - - (!) 24 - - -  06/26/19 1101 - - - 76 15 98 % - -  06/26/19 1100 (!) 167/93 - - 73 14 99 % - -  06/26/19 1059 - - - 65 (!) 9 98 % - -  06/26/19 1058 - - - 76 12 99 % - -  06/26/19 1057 - - - 72 18 99 % - -  06/26/19 1056 - - - 69 11 99 % - -  06/26/19 1055 (!) 162/101 - - 75 15 99 % - -  06/26/19 1054 - - - 65 13 98 % - -  06/26/19 1053 - - - 67 12 100 % - -  06/26/19 1052 - - - 74 14 98 % - -  06/26/19 1051 - - - 76 19 100 % - -  06/26/19 1050 (!) 166/95 - - 65 17 99 % - -  06/26/19 1049 - - - 63 18 98 % - -  06/26/19 1048 - - - 62 (!) 8 98 % - -  06/26/19 1047 - - - 61 10 99 % - -  06/26/19 1046 - - - 65 10 100 % - -  06/26/19 1045 (!) 146/78 - - 62 16 100 % - -  06/26/19   1044 - - - 63 14 100 % - -  06/26/19 1043 - - - 61 15 100 % - -  06/26/19 1042 - - - 64 13 100 % - -  06/26/19 1041 - - - 72 19 100 % - -  06/26/19 1040 (!) 149/96 - - 60 13 100 % - -  06/26/19 1039 - - - (!) 58 12 100 % - -  06/26/19 1038 - - - (!) 59 10 100 % - -  06/26/19 1037 - - - (!) 59 14 100 % - -  06/26/19 1036 - - - (!) 58 11 99 % - -  06/26/19 1035 (!) 164/80 - - 61 13 99 % - -  06/26/19 1034 - - - (!) 58 11 100 % - -  06/26/19 1033 - - - 63 17 100 % - -  06/26/19 1032 - - - 62 13 100 % - -  06/26/19 1031 - - - 60 11 100 % - -  06/26/19 1030 (!) 163/92 - - 64 15 100 % - -  06/26/19 1029 - - - 67 17 100 % - -  06/26/19 1028 - - - 61 13 100 % - -  06/26/19 1027 - - - (!) 57 11 100 % - -  06/26/19 1026 - - - (!) 59 12 99 % - -  06/26/19 1025 (!) 168/96 - - 62 11 99 % - -  06/26/19 1024 - - - (!) 57 10 100 % - -  06/26/19 1023 - - - (!) 57 12 100 % - -  06/26/19 1022 - - - 60 11 99 % - -  06/26/19 1021 - - - 61 10 99 % - -  06/26/19 1020 (!) 166/80 - - 60 10 99 % - -  06/26/19 1019 - - - 60 (!) 9 100 % - -  06/26/19 1018 - - - 61 (!) 9 100 % - -  06/26/19 1017 - - - 72 12 100 % - -  06/26/19 1016 - - - 64 12 99 % - -  06/26/19 1015  - - - 61 11 100 % - -  06/26/19 1014 - - - 76 16 100 % - -  06/26/19 1013 - - - - (!) 24 - - -  06/26/19 1012 - - - - 19 - - -  06/26/19 1011 - - - - 20 - - -  06/26/19 1010 (!) 143/115 - - 65 10 100 % - -  06/26/19 1009 - - - 63 10 100 % - -  06/26/19 1008 (!) 158/84 - - 61 10 100 % - -  06/26/19 1001 (!) 144/77 98.9 F (37.2 C) Oral 65 13 98 % - -  06/26/19 0945 - - - - - - 5\' 8"  (1.727 m) 93.8 kg  .  RECENT EKG RESULTS:    Orders placed or performed during the hospital encounter of 02/05/19  . EKG 12 lead  . EKG 12 lead    DISCHARGE INSTRUCTIONS:    DISCHARGE MEDICATIONS:   Allergies as of 06/27/2019      Reactions   Morphine And Related Nausea And Vomiting      Medication List    TAKE these medications   aspirin 81 MG chewable tablet Chew 81 mg by mouth daily.   BLACK ELDERBERRY PO Take 2 tablets by mouth daily.   cyclobenzaprine 10 MG tablet Commonly known as: FLEXERIL Take 1 tablet (10  mg total) by mouth 3 (three) times daily as needed for muscle spasms.   diclofenac 75 MG EC tablet Commonly known as: VOLTAREN Take 75 mg by mouth 2 (two) times daily.   Fish Oil 1000 MG Cpdr Take 2,000 mg by mouth daily after breakfast.   Garlic 123XX123 MG Caps Take 2,000 mg by mouth daily.   glimepiride 2 MG tablet Commonly known as: AMARYL Take 2 mg by mouth daily with breakfast. Taking 1/2 in morning and 1/2 at dinner time   ibuprofen 200 MG tablet Commonly known as: ADVIL Take 400 mg by mouth every 6 (six) hours as needed for moderate pain.   losartan 50 MG tablet Commonly known as: COZAAR Take 50 mg by mouth at bedtime.   multivitamin tablet Take 1 tablet by mouth daily.   ondansetron 4 MG tablet Commonly known as: Zofran Take 1 tablet (4 mg total) by mouth every 8 (eight) hours as needed for nausea or vomiting.   oxyCODONE-acetaminophen 5-325 MG tablet Commonly known as: Percocet Take 1 tablet by mouth every 4 (four) hours as needed (max 6 q).   SUPER  B COMPLEX PO Take 1 tablet by mouth daily.   vitamin C 1000 MG tablet Take 1,000 mg by mouth daily.   vitamin E 400 UNIT capsule Take 800 Units by mouth daily.       FOLLOW UP VISIT:   Follow-up Information    Justice Britain, MD.   Specialty: Orthopedic Surgery Why: call to be seen in 10-14 days Contact information: [address] [place] [address] [id number]           DISCHARGE TO: Home   DISCHARGE CONDITION:  Thereasa Parkin Kieanna Rollo for Dr. Justice Britain 06/27/2019, 7:45 AM

## 2019-06-27 NOTE — Evaluation (Signed)
Occupational Therapy Evaluation Patient Details Name: Julian Dominguez MRN: [id number] DOB: 06/02/1941 Today's Date: 06/27/2019    History of Present Illness S/P reverse total shoulder arthroplasty, left   Clinical Impression   Pt admitted for L shoulder surgery. Pt currently with functional limitations due to the deficits listed below (see OT Problem List).  Pt will benefit from skilled OT to increase their safety and independence with ADL and functional mobility for ADL to facilitate discharge to venue listed below.   Handout provided and education complete     Follow Up Recommendations  Follow surgeon's recommendation for DC plan and follow-up therapies    Equipment Recommendations  None recommended by OT    Recommendations for Other Services       Precautions / Restrictions Precautions Precautions: Shoulder Shoulder Interventions: Shoulder sling/immobilizer;Off for dressing/bathing/exercises Precaution Comments: OT eval and treat If sitting in controlled environment, ok to come out of sling to give neck a break. Please sleep in it to protect until follow up in office. OK to use operative arm for feeding, hygiene and ADLs. Ok to instruct Pendulums and lap Required Braces or Orthoses: Sling Restrictions Weight Bearing Restrictions: Yes LUE Weight Bearing: Non weight bearing      Mobility Bed Mobility Overal bed mobility: Modified Independent                Transfers                 General transfer comment: VC not to push up with LUE    Balance Overall balance assessment: Mild deficits observed, not formally tested                                         ADL either performed or assessed with clinical judgement         Vision Patient Visual Report: No change from baseline              Pertinent Vitals/Pain Pain Assessment: No/denies pain           Communication     Cognition Arousal/Alertness:  Awake/alert Behavior During Therapy: WFL for tasks assessed/performed Overall Cognitive Status: Within Functional Limits for tasks assessed                                           Shoulder Instructions Shoulder Instructions Donning/doffing shirt without moving shoulder: Minimal assistance;Patient able to independently direct caregiver Method for sponge bathing under operated UE: Minimal assistance;Patient able to independently direct caregiver Donning/doffing sling/immobilizer: Minimal assistance;Patient able to independently direct caregiver Correct positioning of sling/immobilizer: Minimal assistance;Patient able to independently direct caregiver Pendulum exercises (written home exercise program): Minimal assistance;Patient able to independently direct caregiver ROM for elbow, wrist and digits of operated UE: Minimal assistance;Patient able to independently direct caregiver Sling wearing schedule (on at all times/off for ADL's): Minimal assistance;Patient able to independently direct caregiver Dressing change: Minimal assistance;Patient able to independently direct caregiver Positioning of UE while sleeping: Minimal assistance;Patient able to independently direct caregiver    Home Living Family/patient expects to be discharged to:: Private residence Living Arrangements: Spouse/significant other Available Help at Discharge: Family Type of Home: House Home Access: Level entry     [place]: One level     Bathroom Shower/Tub: Hospital doctor  Toilet: Standard     Home Equipment: Shower seat;Adaptive equipment   Additional Comments: lives [place] a farm and son in law is mowing the yard for him . 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                 OT Goals(Current goals can be found in the care plan section) Acute Rehab OT  Goals Patient Stated Goal: home today with wife OT Goal Formulation: With patient Potential to Achieve Goals: Good  OT Frequency:      AM-PAC OT "6 Clicks" Daily Activity     Outcome Measure Help from another person eating meals?: A Little Help from another person taking care of personal grooming?: A Little Help from another person toileting, which includes using toliet, bedpan, or urinal?: A Little Help from another person bathing (including washing, rinsing, drying)?: A Little Help from another person to put on and taking off regular upper body clothing?: A Little Help from another person to put on and taking off regular lower body clothing?: A Little 6 Click Score: 18   End of Session Equipment Utilized During Treatment: Other (comment)(sling)  Activity Tolerance: Patient tolerated treatment well Patient left: in chair;with call bell/phone within reach                   Time: 1030-1107 OT Time Calculation (min): 37 min Charges:  OT General Charges $OT Visit: 1 Visit OT Evaluation $OT Eval Low Complexity: 1 Low OT Treatments $Self Care/Home Management : 8-22 mins  Kari Baars, [place] [phone] Office- [place], Edwena Felty D 06/27/2019, 11:12 AM

## 2019-06-27 NOTE — Plan of Care (Signed)

## 2019-06-28 ENCOUNTER — Encounter: Payer: Self-pay | Admitting: *Deleted

## 2019-07-16 DIAGNOSIS — F17210 Nicotine dependence, cigarettes, uncomplicated: Secondary | ICD-10-CM | POA: Diagnosis not present

## 2019-07-16 DIAGNOSIS — Z299 Encounter for prophylactic measures, unspecified: Secondary | ICD-10-CM | POA: Diagnosis not present

## 2019-07-16 DIAGNOSIS — Z7189 Other specified counseling: Secondary | ICD-10-CM | POA: Diagnosis not present

## 2019-07-16 DIAGNOSIS — Z0000 Encounter for general adult medical examination without abnormal findings: Secondary | ICD-10-CM | POA: Diagnosis not present

## 2019-07-16 DIAGNOSIS — Z6831 Body mass index (BMI) 31.0-31.9, adult: Secondary | ICD-10-CM | POA: Diagnosis not present

## 2019-07-16 DIAGNOSIS — Z1331 Encounter for screening for depression: Secondary | ICD-10-CM | POA: Diagnosis not present

## 2019-07-16 DIAGNOSIS — I10 Essential (primary) hypertension: Secondary | ICD-10-CM | POA: Diagnosis not present

## 2019-07-16 DIAGNOSIS — R5383 Other fatigue: Secondary | ICD-10-CM | POA: Diagnosis not present

## 2019-07-16 DIAGNOSIS — E7800 Pure hypercholesterolemia, unspecified: Secondary | ICD-10-CM | POA: Diagnosis not present

## 2019-07-16 DIAGNOSIS — E1165 Type 2 diabetes mellitus with hyperglycemia: Secondary | ICD-10-CM | POA: Diagnosis not present

## 2019-07-16 DIAGNOSIS — Z1339 Encounter for screening examination for other mental health and behavioral disorders: Secondary | ICD-10-CM | POA: Diagnosis not present

## 2019-07-16 DIAGNOSIS — Z1211 Encounter for screening for malignant neoplasm of colon: Secondary | ICD-10-CM | POA: Diagnosis not present

## 2019-07-18 DIAGNOSIS — M25512 Pain in left shoulder: Secondary | ICD-10-CM | POA: Diagnosis not present

## 2019-07-23 DIAGNOSIS — M25512 Pain in left shoulder: Secondary | ICD-10-CM | POA: Diagnosis not present

## 2019-07-25 DIAGNOSIS — Z299 Encounter for prophylactic measures, unspecified: Secondary | ICD-10-CM | POA: Diagnosis not present

## 2019-07-25 DIAGNOSIS — D509 Iron deficiency anemia, unspecified: Secondary | ICD-10-CM | POA: Diagnosis not present

## 2019-07-25 DIAGNOSIS — E1165 Type 2 diabetes mellitus with hyperglycemia: Secondary | ICD-10-CM | POA: Diagnosis not present

## 2019-07-25 DIAGNOSIS — D649 Anemia, unspecified: Secondary | ICD-10-CM | POA: Diagnosis not present

## 2019-07-25 DIAGNOSIS — Z6831 Body mass index (BMI) 31.0-31.9, adult: Secondary | ICD-10-CM | POA: Diagnosis not present

## 2019-07-25 DIAGNOSIS — M25512 Pain in left shoulder: Secondary | ICD-10-CM | POA: Diagnosis not present

## 2019-07-25 DIAGNOSIS — I10 Essential (primary) hypertension: Secondary | ICD-10-CM | POA: Diagnosis not present

## 2019-07-25 DIAGNOSIS — R5383 Other fatigue: Secondary | ICD-10-CM | POA: Diagnosis not present

## 2019-07-25 DIAGNOSIS — Z789 Other specified health status: Secondary | ICD-10-CM | POA: Diagnosis not present

## 2019-07-31 DIAGNOSIS — Z23 Encounter for immunization: Secondary | ICD-10-CM | POA: Diagnosis not present

## 2019-08-01 DIAGNOSIS — M25512 Pain in left shoulder: Secondary | ICD-10-CM | POA: Diagnosis not present

## 2019-08-03 DIAGNOSIS — M25512 Pain in left shoulder: Secondary | ICD-10-CM | POA: Diagnosis not present

## 2019-08-06 DIAGNOSIS — M25512 Pain in left shoulder: Secondary | ICD-10-CM | POA: Diagnosis not present

## 2019-08-08 DIAGNOSIS — M25512 Pain in left shoulder: Secondary | ICD-10-CM | POA: Diagnosis not present

## 2019-08-10 DIAGNOSIS — I10 Essential (primary) hypertension: Secondary | ICD-10-CM | POA: Diagnosis not present

## 2019-08-10 DIAGNOSIS — K5909 Other constipation: Secondary | ICD-10-CM | POA: Diagnosis not present

## 2019-08-10 DIAGNOSIS — Z6830 Body mass index (BMI) 30.0-30.9, adult: Secondary | ICD-10-CM | POA: Diagnosis not present

## 2019-08-10 DIAGNOSIS — E1165 Type 2 diabetes mellitus with hyperglycemia: Secondary | ICD-10-CM | POA: Diagnosis not present

## 2019-08-10 DIAGNOSIS — Z299 Encounter for prophylactic measures, unspecified: Secondary | ICD-10-CM | POA: Diagnosis not present

## 2019-08-13 DIAGNOSIS — M25512 Pain in left shoulder: Secondary | ICD-10-CM | POA: Diagnosis not present

## 2019-08-15 DIAGNOSIS — M25512 Pain in left shoulder: Secondary | ICD-10-CM | POA: Diagnosis not present

## 2019-08-20 DIAGNOSIS — M25512 Pain in left shoulder: Secondary | ICD-10-CM | POA: Diagnosis not present

## 2019-08-22 DIAGNOSIS — M25512 Pain in left shoulder: Secondary | ICD-10-CM | POA: Diagnosis not present

## 2019-08-27 DIAGNOSIS — M25512 Pain in left shoulder: Secondary | ICD-10-CM | POA: Diagnosis not present

## 2019-08-29 DIAGNOSIS — M25512 Pain in left shoulder: Secondary | ICD-10-CM | POA: Diagnosis not present

## 2019-08-29 DIAGNOSIS — D649 Anemia, unspecified: Secondary | ICD-10-CM | POA: Diagnosis not present

## 2019-08-31 DIAGNOSIS — Z23 Encounter for immunization: Secondary | ICD-10-CM | POA: Diagnosis not present

## 2019-09-03 DIAGNOSIS — M25512 Pain in left shoulder: Secondary | ICD-10-CM | POA: Diagnosis not present

## 2019-09-05 DIAGNOSIS — M25512 Pain in left shoulder: Secondary | ICD-10-CM | POA: Diagnosis not present

## 2019-09-11 DIAGNOSIS — M25512 Pain in left shoulder: Secondary | ICD-10-CM | POA: Diagnosis not present

## 2019-09-13 DIAGNOSIS — M25512 Pain in left shoulder: Secondary | ICD-10-CM | POA: Diagnosis not present

## 2019-09-17 DIAGNOSIS — M25512 Pain in left shoulder: Secondary | ICD-10-CM | POA: Diagnosis not present

## 2019-09-20 DIAGNOSIS — M25512 Pain in left shoulder: Secondary | ICD-10-CM | POA: Diagnosis not present

## 2019-09-24 DIAGNOSIS — Z96612 Presence of left artificial shoulder joint: Secondary | ICD-10-CM | POA: Diagnosis not present

## 2019-09-24 DIAGNOSIS — Z471 Aftercare following joint replacement surgery: Secondary | ICD-10-CM | POA: Diagnosis not present

## 2019-11-21 DIAGNOSIS — Z299 Encounter for prophylactic measures, unspecified: Secondary | ICD-10-CM | POA: Diagnosis not present

## 2019-11-21 DIAGNOSIS — Z6830 Body mass index (BMI) 30.0-30.9, adult: Secondary | ICD-10-CM | POA: Diagnosis not present

## 2019-11-21 DIAGNOSIS — I10 Essential (primary) hypertension: Secondary | ICD-10-CM | POA: Diagnosis not present

## 2019-11-21 DIAGNOSIS — E1165 Type 2 diabetes mellitus with hyperglycemia: Secondary | ICD-10-CM | POA: Diagnosis not present

## 2019-11-21 DIAGNOSIS — D649 Anemia, unspecified: Secondary | ICD-10-CM | POA: Diagnosis not present

## 2020-03-17 DIAGNOSIS — I10 Essential (primary) hypertension: Secondary | ICD-10-CM | POA: Diagnosis not present

## 2020-03-17 DIAGNOSIS — Z713 Dietary counseling and surveillance: Secondary | ICD-10-CM | POA: Diagnosis not present

## 2020-03-17 DIAGNOSIS — E7800 Pure hypercholesterolemia, unspecified: Secondary | ICD-10-CM | POA: Diagnosis not present

## 2020-03-17 DIAGNOSIS — E1165 Type 2 diabetes mellitus with hyperglycemia: Secondary | ICD-10-CM | POA: Diagnosis not present

## 2020-06-02 DIAGNOSIS — Z23 Encounter for immunization: Secondary | ICD-10-CM | POA: Diagnosis not present

## 2020-06-27 DIAGNOSIS — Z79899 Other long term (current) drug therapy: Secondary | ICD-10-CM | POA: Diagnosis not present

## 2020-06-27 DIAGNOSIS — Z6832 Body mass index (BMI) 32.0-32.9, adult: Secondary | ICD-10-CM | POA: Diagnosis not present

## 2020-06-27 DIAGNOSIS — E1165 Type 2 diabetes mellitus with hyperglycemia: Secondary | ICD-10-CM | POA: Diagnosis not present

## 2020-06-27 DIAGNOSIS — Z1331 Encounter for screening for depression: Secondary | ICD-10-CM | POA: Diagnosis not present

## 2020-06-27 DIAGNOSIS — F17210 Nicotine dependence, cigarettes, uncomplicated: Secondary | ICD-10-CM | POA: Diagnosis not present

## 2020-06-27 DIAGNOSIS — Z125 Encounter for screening for malignant neoplasm of prostate: Secondary | ICD-10-CM | POA: Diagnosis not present

## 2020-06-27 DIAGNOSIS — Z299 Encounter for prophylactic measures, unspecified: Secondary | ICD-10-CM | POA: Diagnosis not present

## 2020-06-27 DIAGNOSIS — Z7189 Other specified counseling: Secondary | ICD-10-CM | POA: Diagnosis not present

## 2020-06-27 DIAGNOSIS — E7800 Pure hypercholesterolemia, unspecified: Secondary | ICD-10-CM | POA: Diagnosis not present

## 2020-06-27 DIAGNOSIS — Z0000 Encounter for general adult medical examination without abnormal findings: Secondary | ICD-10-CM | POA: Diagnosis not present

## 2020-06-27 DIAGNOSIS — I10 Essential (primary) hypertension: Secondary | ICD-10-CM | POA: Diagnosis not present

## 2020-06-27 DIAGNOSIS — Z1339 Encounter for screening examination for other mental health and behavioral disorders: Secondary | ICD-10-CM | POA: Diagnosis not present

## 2020-06-27 DIAGNOSIS — R5383 Other fatigue: Secondary | ICD-10-CM | POA: Diagnosis not present

## 2020-07-17 DIAGNOSIS — E1165 Type 2 diabetes mellitus with hyperglycemia: Secondary | ICD-10-CM | POA: Diagnosis not present

## 2020-08-18 DIAGNOSIS — E1165 Type 2 diabetes mellitus with hyperglycemia: Secondary | ICD-10-CM | POA: Diagnosis not present

## 2020-09-15 DIAGNOSIS — E1165 Type 2 diabetes mellitus with hyperglycemia: Secondary | ICD-10-CM | POA: Diagnosis not present

## 2020-10-07 DIAGNOSIS — Z299 Encounter for prophylactic measures, unspecified: Secondary | ICD-10-CM | POA: Diagnosis not present

## 2020-10-07 DIAGNOSIS — E1140 Type 2 diabetes mellitus with diabetic neuropathy, unspecified: Secondary | ICD-10-CM | POA: Diagnosis not present

## 2020-10-07 DIAGNOSIS — E1165 Type 2 diabetes mellitus with hyperglycemia: Secondary | ICD-10-CM | POA: Diagnosis not present

## 2020-10-07 DIAGNOSIS — I10 Essential (primary) hypertension: Secondary | ICD-10-CM | POA: Diagnosis not present

## 2020-10-16 DIAGNOSIS — E1165 Type 2 diabetes mellitus with hyperglycemia: Secondary | ICD-10-CM | POA: Diagnosis not present

## 2020-10-28 IMAGING — MR MR LUMBAR SPINE W/O CM
4 of 5 series · 18 of 48 positions shown · non-contrast
Comparison: None.

CLINICAL DATA: Low back pain.  Unspecified back pain laterality.

EXAM:
MRI LUMBAR SPINE WITHOUT CONTRAST
TECHNIQUE: Multiplanar, multisequence MR imaging of the lumbar spine was
performed. No intravenous contrast was administered.

[Series 6: T2 · sagittal · 4.0mm · 0.73mm/px · 6 of 17 slices shown (1 of 2)]
[im 1/17]
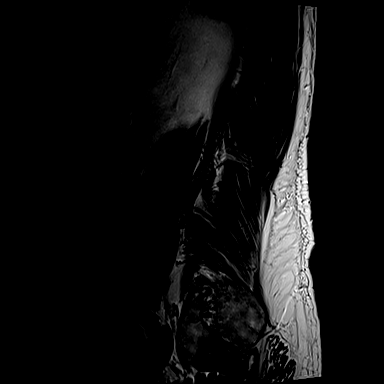
[im 4/17]
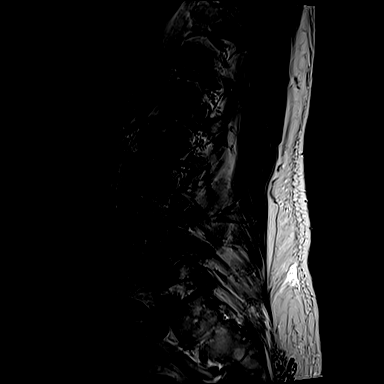
[im 7/17]
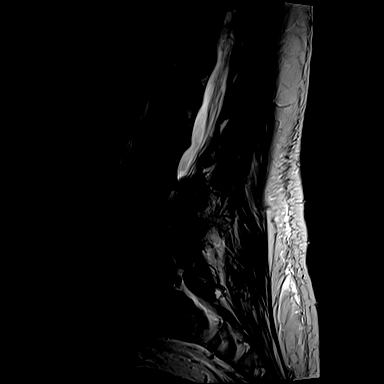
[im 10/17]
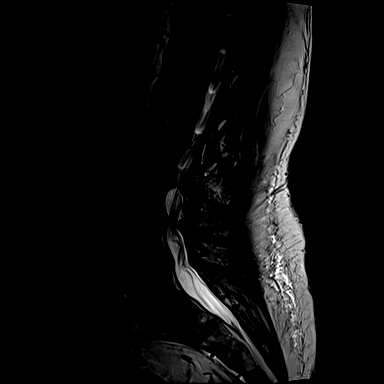
[im 13/17]
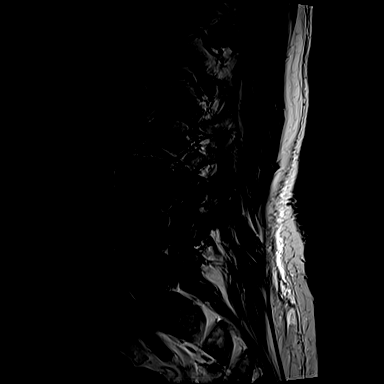
[im 17/17]
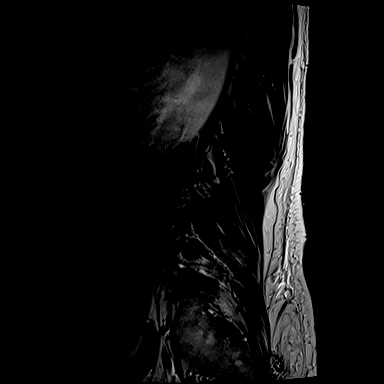

[Series 7: T1 · sagittal · 4.0mm · 0.73mm/px · 3 of 17 slices shown (1 of 2)]
[im 1/17]
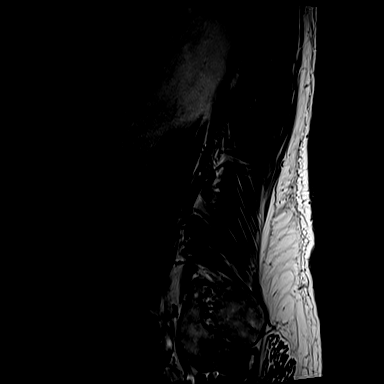
[im 9/17]
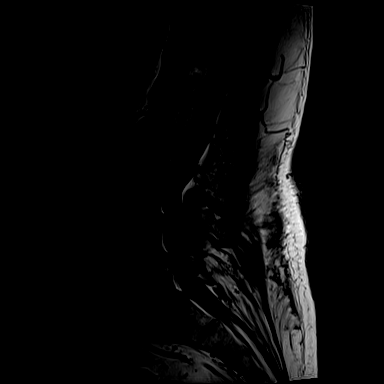
[im 17/17]
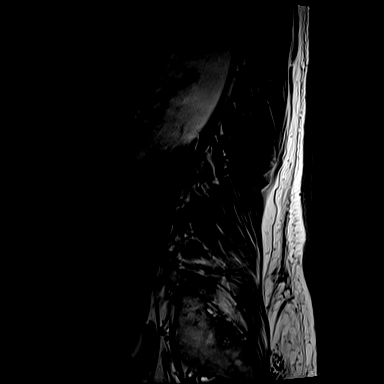

[Series 11: T1 · axial · 4.0mm · 0.28mm/px · z∈[-115,+88]mm · 3 of 52 slices shown (2 of 2)]
[im 7/52]
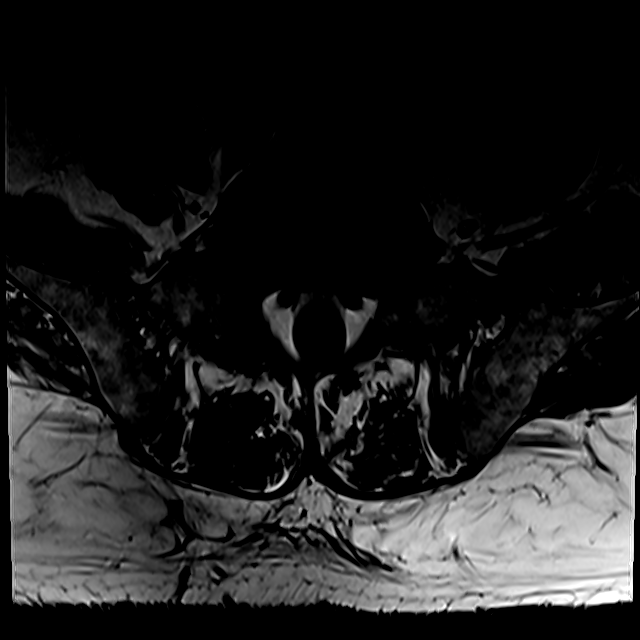
[im 28/52]
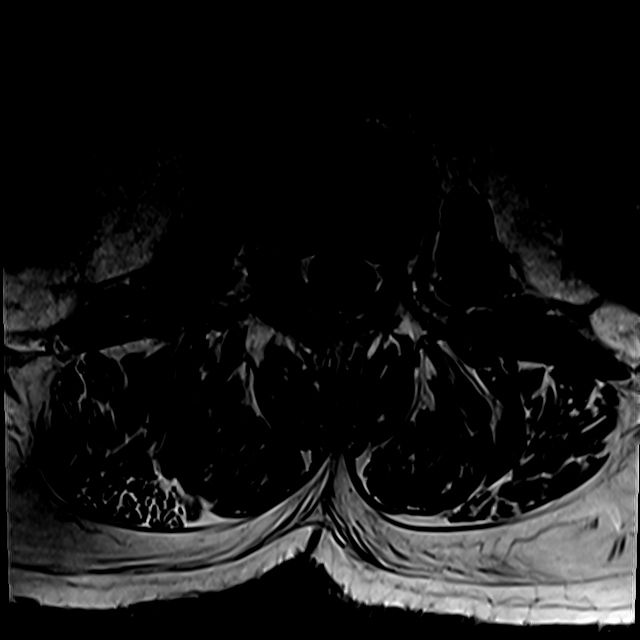
[im 45/52]
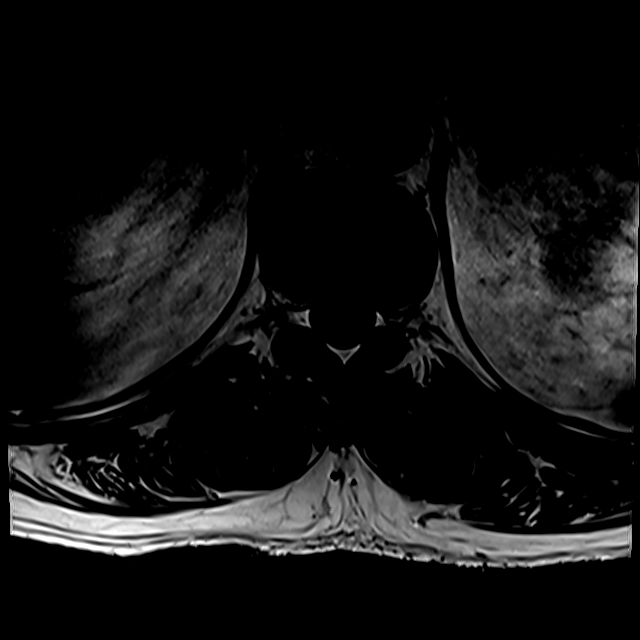

[Series 100: T2 · axial · 4.0mm · 0.28mm/px · z∈[-130,+88]mm · 6 of 52 slices shown (2 of 2)]
[im 4/52]
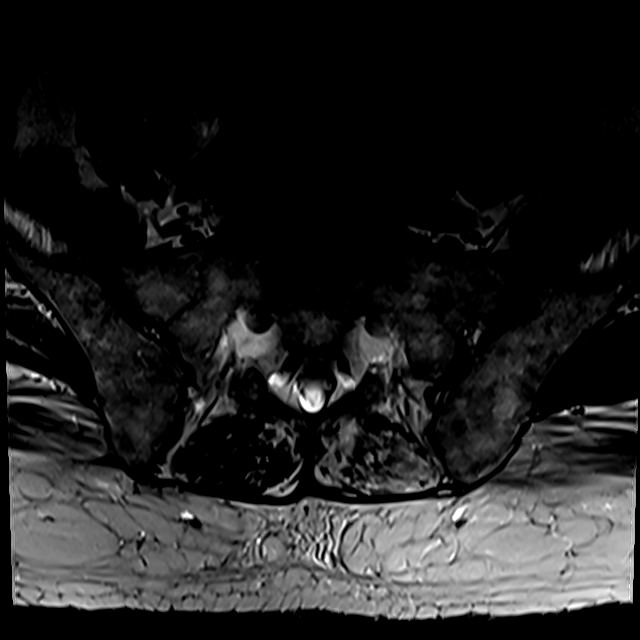
[im 7/52]
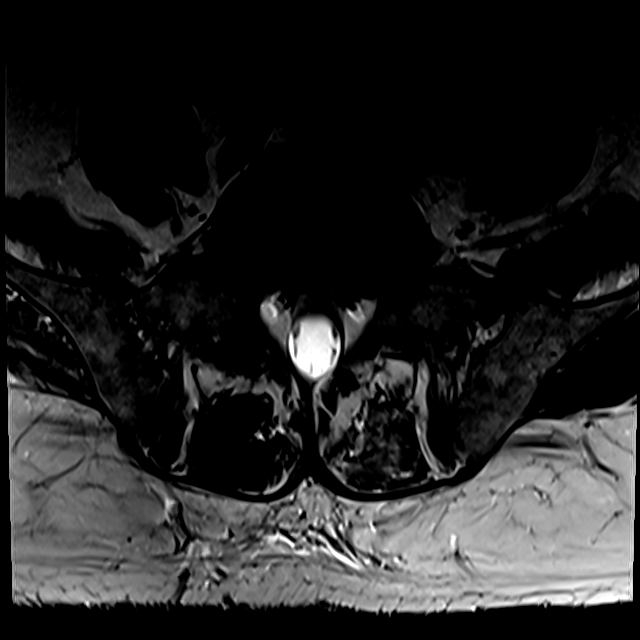
[im 11/52]
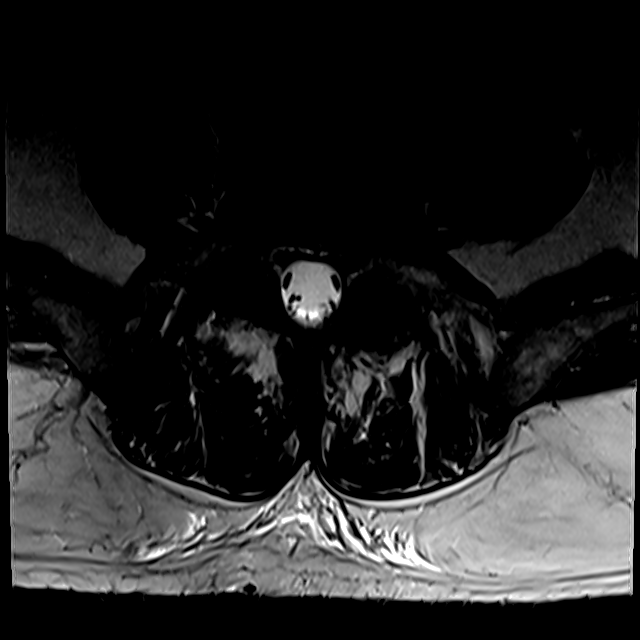
[im 18/52]
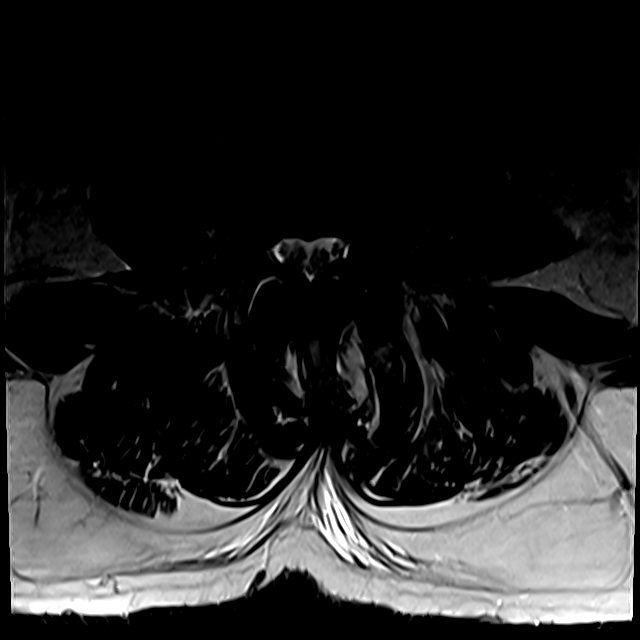
[im 28/52]
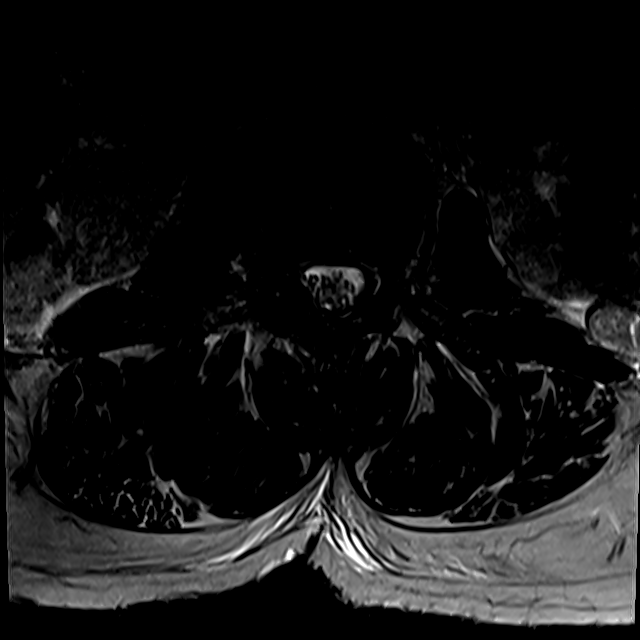
[im 45/52]
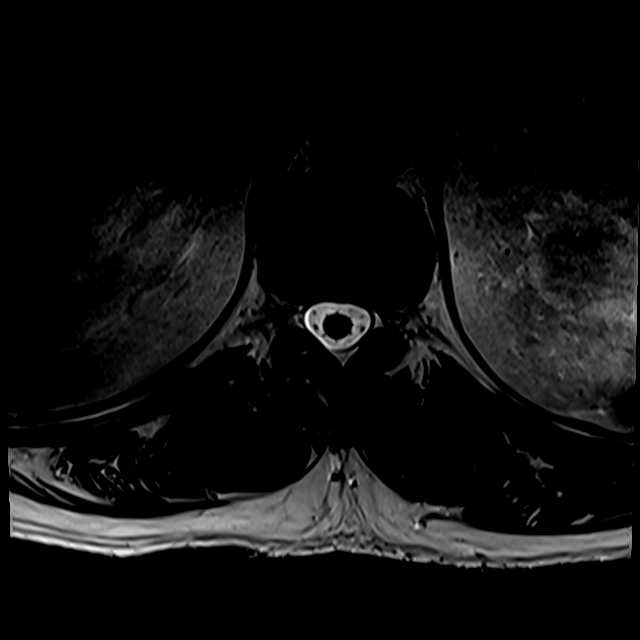

[18 of 48 positions shown; findings below may reference images not displayed]

FINDINGS: Segmentation: 5 non rib-bearing lumbar type vertebral bodies are
present. The lowest fully formed vertebral body is L5.

Alignment: Slight anterolisthesis is present at L3-4 and L4-5. There
is grade 1 retrolisthesis at L2-3.

Vertebrae: Chronic endplate marrow changes are most pronounced on
the left at L3-4 and L4-5 and on the right at L2-3.

Conus medullaris and cauda equina: Conus extends to the L1-2 level.
Conus and cauda equina appear normal.

Paraspinal and other soft tissues: Limited imaging the abdomen is
unremarkable. There is no significant adenopathy. No solid organ
lesions are present.

Disc levels:

L1-2: Mild disc bulging is asymmetric to the right. There is mild
right foraminal narrowing.

L2-3: A broad-based disc protrusion is present. Moderate facet
hypertrophy is noted bilaterally. Mild subarticular narrowing is
present. Moderate foraminal stenosis is present bilaterally, right
greater than left.

L3-4: A broad-based disc protrusion is present. Moderate central
canal stenosis is evident. Facet hypertrophy is worse on the left.
Moderate left and mild right foraminal stenosis is present.

L4-5: Advanced facet hypertrophy is noted bilaterally. A shallow
central disc protrusion and annular tear are present. Mild
subarticular narrowing is noted. Moderate left and mild right
foraminal stenosis is present.

L5-S1: Facet hypertrophy is worse on the right. No significant
stenosis is present.
IMPRESSION: 1. Multilevel spondylosis of the lumbar spine with scoliosis as
described.
2. Mild right foraminal narrowing at L1-2.
3. Mild subarticular and moderate foraminal stenosis bilaterally at
L2-3 is worse on the right.
4. Moderate central canal stenosis at L3-4 is present with
subarticular narrowing bilaterally, worse on the left.
5. Moderate left and mild right foraminal stenosis at L3-4.
6. Moderate left and mild right foraminal stenosis at L4-5.
7. Asymmetric right-sided facet hypertrophy at L5-S1 without
significant stenosis.

## 2020-11-06 DIAGNOSIS — E119 Type 2 diabetes mellitus without complications: Secondary | ICD-10-CM | POA: Diagnosis not present

## 2020-11-15 DIAGNOSIS — E1165 Type 2 diabetes mellitus with hyperglycemia: Secondary | ICD-10-CM | POA: Diagnosis not present

## 2020-12-16 DIAGNOSIS — E1165 Type 2 diabetes mellitus with hyperglycemia: Secondary | ICD-10-CM | POA: Diagnosis not present

## 2020-12-18 DIAGNOSIS — I10 Essential (primary) hypertension: Secondary | ICD-10-CM | POA: Diagnosis not present

## 2020-12-18 DIAGNOSIS — E1165 Type 2 diabetes mellitus with hyperglycemia: Secondary | ICD-10-CM | POA: Diagnosis not present

## 2020-12-18 DIAGNOSIS — Z299 Encounter for prophylactic measures, unspecified: Secondary | ICD-10-CM | POA: Diagnosis not present

## 2021-01-12 DIAGNOSIS — I10 Essential (primary) hypertension: Secondary | ICD-10-CM | POA: Diagnosis not present

## 2021-01-12 DIAGNOSIS — E1165 Type 2 diabetes mellitus with hyperglycemia: Secondary | ICD-10-CM | POA: Diagnosis not present

## 2021-01-12 DIAGNOSIS — Z299 Encounter for prophylactic measures, unspecified: Secondary | ICD-10-CM | POA: Diagnosis not present

## 2021-01-15 DIAGNOSIS — E1165 Type 2 diabetes mellitus with hyperglycemia: Secondary | ICD-10-CM | POA: Diagnosis not present

## 2021-02-02 DIAGNOSIS — N400 Enlarged prostate without lower urinary tract symptoms: Secondary | ICD-10-CM | POA: Diagnosis not present

## 2021-02-02 DIAGNOSIS — Z299 Encounter for prophylactic measures, unspecified: Secondary | ICD-10-CM | POA: Diagnosis not present

## 2021-02-02 DIAGNOSIS — E1165 Type 2 diabetes mellitus with hyperglycemia: Secondary | ICD-10-CM | POA: Diagnosis not present

## 2021-02-02 DIAGNOSIS — Z6832 Body mass index (BMI) 32.0-32.9, adult: Secondary | ICD-10-CM | POA: Diagnosis not present

## 2021-02-02 DIAGNOSIS — I10 Essential (primary) hypertension: Secondary | ICD-10-CM | POA: Diagnosis not present

## 2021-02-02 DIAGNOSIS — Z87891 Personal history of nicotine dependence: Secondary | ICD-10-CM | POA: Diagnosis not present

## 2021-02-02 DIAGNOSIS — R109 Unspecified abdominal pain: Secondary | ICD-10-CM | POA: Diagnosis not present

## 2021-02-03 ENCOUNTER — Other Ambulatory Visit: Payer: Self-pay | Admitting: Internal Medicine

## 2021-02-03 ENCOUNTER — Ambulatory Visit (HOSPITAL_COMMUNITY)
Admission: RE | Admit: 2021-02-03 | Discharge: 2021-02-03 | Disposition: A | Discharge status: Home / Self Care | Payer: Medicare Other | Source: Ambulatory Visit | Attending: Internal Medicine | Admitting: Internal Medicine

## 2021-02-03 ENCOUNTER — Other Ambulatory Visit (HOSPITAL_COMMUNITY): Payer: Self-pay | Admitting: Internal Medicine

## 2021-02-03 ENCOUNTER — Other Ambulatory Visit: Payer: Self-pay

## 2021-02-03 DIAGNOSIS — R109 Unspecified abdominal pain: Secondary | ICD-10-CM

## 2021-02-03 DIAGNOSIS — K458 Other specified abdominal hernia without obstruction or gangrene: Secondary | ICD-10-CM | POA: Diagnosis not present

## 2021-02-03 DIAGNOSIS — K449 Diaphragmatic hernia without obstruction or gangrene: Secondary | ICD-10-CM | POA: Diagnosis not present

## 2021-02-03 DIAGNOSIS — N132 Hydronephrosis with renal and ureteral calculous obstruction: Secondary | ICD-10-CM | POA: Diagnosis not present

## 2021-02-03 DIAGNOSIS — N400 Enlarged prostate without lower urinary tract symptoms: Secondary | ICD-10-CM | POA: Diagnosis not present

## 2021-02-04 ENCOUNTER — Other Ambulatory Visit (HOSPITAL_COMMUNITY)
Admission: RE | Admit: 2021-02-04 | Discharge: 2021-02-04 | Disposition: A | Discharge status: Home / Self Care | Payer: Medicare Other | Source: Ambulatory Visit | Attending: Internal Medicine | Admitting: Internal Medicine

## 2021-02-04 DIAGNOSIS — R109 Unspecified abdominal pain: Secondary | ICD-10-CM | POA: Insufficient documentation

## 2021-02-04 DIAGNOSIS — N201 Calculus of ureter: Secondary | ICD-10-CM | POA: Diagnosis not present

## 2021-02-04 LAB — BASIC METABOLIC PANEL
Anion gap: 11 (ref 5–15)
BUN: 26 mg/dL — ABNORMAL HIGH (ref 8–23)
CO2: 23 mmol/L (ref 22–32)
Calcium: 9.8 mg/dL (ref 8.9–10.3)
Chloride: 101 mmol/L (ref 98–111)
Creatinine, Ser: 1.54 mg/dL — ABNORMAL HIGH (ref 0.61–1.24)
GFR, Estimated: 46 mL/min — ABNORMAL LOW (ref >60)
Glucose, Bld: 173 mg/dL — ABNORMAL HIGH (ref 70–99)
Potassium: 4.5 mmol/L (ref 3.5–5.1)
Sodium: 135 mmol/L (ref 135–145)

## 2021-02-04 LAB — CBC
HCT: 36.4 % — ABNORMAL LOW (ref 39.0–52.0)
Hemoglobin: 11.8 g/dL — ABNORMAL LOW (ref 13.0–17.0)
MCH: 30.3 pg (ref 26.0–34.0)
MCHC: 32.4 g/dL (ref 30.0–36.0)
MCV: 93.3 fL (ref 80.0–100.0)
Platelets: 213 10*3/uL (ref 150–400)
RBC: 3.9 MIL/uL — ABNORMAL LOW (ref 4.22–5.81)
RDW: 13 % (ref 11.5–15.5)
WBC: 9.5 10*3/uL (ref 4.0–10.5)
nRBC: 0 % (ref 0.0–0.2)

## 2021-02-11 DIAGNOSIS — E119 Type 2 diabetes mellitus without complications: Secondary | ICD-10-CM | POA: Diagnosis not present

## 2021-02-11 DIAGNOSIS — Z961 Presence of intraocular lens: Secondary | ICD-10-CM | POA: Diagnosis not present

## 2021-02-11 DIAGNOSIS — Z7984 Long term (current) use of oral hypoglycemic drugs: Secondary | ICD-10-CM | POA: Diagnosis not present

## 2021-02-13 DIAGNOSIS — N202 Calculus of kidney with calculus of ureter: Secondary | ICD-10-CM | POA: Diagnosis not present

## 2021-02-13 DIAGNOSIS — N201 Calculus of ureter: Secondary | ICD-10-CM | POA: Diagnosis not present

## 2021-02-24 DIAGNOSIS — Z87448 Personal history of other diseases of urinary system: Secondary | ICD-10-CM | POA: Diagnosis not present

## 2021-02-24 DIAGNOSIS — K429 Umbilical hernia without obstruction or gangrene: Secondary | ICD-10-CM | POA: Diagnosis not present

## 2021-02-24 DIAGNOSIS — N400 Enlarged prostate without lower urinary tract symptoms: Secondary | ICD-10-CM | POA: Diagnosis not present

## 2021-02-24 DIAGNOSIS — N201 Calculus of ureter: Secondary | ICD-10-CM | POA: Diagnosis not present

## 2021-02-24 DIAGNOSIS — K5730 Diverticulosis of large intestine without perforation or abscess without bleeding: Secondary | ICD-10-CM | POA: Diagnosis not present

## 2021-02-25 ENCOUNTER — Other Ambulatory Visit: Payer: Self-pay | Admitting: Urology

## 2021-02-25 ENCOUNTER — Encounter (HOSPITAL_BASED_OUTPATIENT_CLINIC_OR_DEPARTMENT_OTHER): Payer: Self-pay | Admitting: Urology

## 2021-02-25 ENCOUNTER — Other Ambulatory Visit: Payer: Self-pay

## 2021-02-25 DIAGNOSIS — Z974 Presence of external hearing-aid: Secondary | ICD-10-CM

## 2021-02-25 HISTORY — DX: Presence of external hearing-aid: Z97.4

## 2021-02-25 NOTE — Progress Notes (Signed)
Spoke w/ via phone for pre-op interview---pt and wife Julian Dominguez per pt request Lab needs dos----     ekg          Lab results------cbc & bmp 02-04-2021 epic COVID test -----patient states asymptomatic no test needed Arrive at -------730 am 03-02-2021 NPO after MN NO Solid Food.  Clear liquids from MN until---630 am then npo Med rec completed Medications to take morning of surgery -----none Diabetic medication -----none day of surgery Patient instructed to bring photo id and insurance card day of surgery Patient aware to have Driver (ride ) / caregiver  wife Julian Dominguez   for 24 hours after surgery  Patient Special Instructions -----none Pre-Op special Istructions -----none Patient verbalized understanding of instructions that were given at this phone interview. Patient denies shortness of breath, chest pain, fever, cough at this phone interview.

## 2021-02-27 ENCOUNTER — Other Ambulatory Visit: Payer: Self-pay | Admitting: Urology

## 2021-03-01 NOTE — H&P (Signed)
H&P  Chief Complaint: Kidney stone  History of Present Illness: Julian Dominguez is a 80 y.o. year old male present for ureteroscopic mgmt of a Lt distal ureteral stone. This has been symptomatic for ~ 2 weeks.  Past Medical History:  Diagnosis Date   Arthritis    hands and back   Diverticulosis    at 04-28-11 colon    History of heart murmur in childhood    History of kidney stones    Hyperlipidemia    takes red yeast rice    Hypertension    Tubular adenoma of colon 04/28/2011   Type 2 dm    Wears hearing aid in both ears 02/25/2021    Past Surgical History:  Procedure Laterality Date   ANKLE FRACTURE SURGERY Right 1996   hardware   ANTERIOR LAT LUMBAR FUSION Left 02/13/2019   Procedure: Left side Lumbar two-three Lumbar three-four Lumbar four-five Anterolateral decompression, posterior pedicle screw fixation;  Surgeon: Kristeen Miss, MD;  Location: [facility];  Service: Neurosurgery;  Laterality: Left;   APPLICATION OF ROBOTIC ASSISTANCE FOR SPINAL PROCEDURE N/A 02/13/2019   Procedure: APPLICATION OF ROBOTIC ASSISTANCE FOR SPINAL PROCEDURE;  Surgeon: Kristeen Miss, MD;  Location: [facility];  Service: Neurosurgery;  Laterality: N/A;   CATARACT EXTRACTION, BILATERAL  2011   CHOLECYSTECTOMY  1997   COLONOSCOPY  2017   cyst removed from finger     yrs ago   EYE SURGERY     bilateral cataract surgery with lens implants    LUMBAR PERCUTANEOUS PEDICLE SCREW 3 LEVEL N/A 02/13/2019   Procedure: LUMBAR PERCUTANEOUS PEDICLE SCREW LUMBAR TWO TO LUMBAR FIVE;  Surgeon: Kristeen Miss, MD;  Location: [facility];  Service: Neurosurgery;  Laterality: N/A;   NECK SURGERY  2018   POLYPECTOMY     REVERSE SHOULDER ARTHROPLASTY Left 06/26/2019   Procedure: REVERSE SHOULDER ARTHROPLASTY;  Surgeon: Justice Britain, MD;  Location: [facility];  Service: Orthopedics;  Laterality: Left;  126mn    Home Medications:  No medications prior to admission.    Allergies:  Allergies  Allergen Reactions   Morphine  And Related Nausea And Vomiting    Family History  Problem Relation Age of Onset   Colon cancer Father 63  Stomach cancer Maternal Aunt    Colon cancer Maternal Uncle    Stomach cancer Maternal Grandfather 557  Colon cancer Cousin    Colon cancer Cousin    Colon polyps Neg Hx    Rectal cancer Neg Hx     Social History:  reports that he quit smoking about 51 years ago. His smoking use included cigarettes. His smokeless tobacco use includes chew. He reports that he does not drink alcohol and does not use drugs.  ROS: A complete review of systems was performed.  All systems are negative except for pertinent findings as noted.  Physical Exam:  Vital signs in last 24 hours:   General:  Alert and oriented, No acute distress HEENT: Normocephalic, atraumatic Neck: No JVD or lymphadenopathy Cardiovascular: Regular rate  Lungs: Normal inspiratory/expiratory excursion Abdomen: Soft, nontender, nondistended, no abdominal masses Back: No CVA tenderness Extremities: No edema Neurologic: Grossly intact  Laboratory Data:  No results found for this or any previous visit (from the past 24 hour(s)). No results found for this or any previous visit (from the past 240 hour(s)). Creatinine: No results for input(s): CREATININE in the last 168 hours.  Radiologic Imaging: No results found.  Impression/Assessment:  Lt distal ureteral stone  Plan: it  appears the patient has passed a stone.  We will cancel his procedure. 03/01/2021, 8:25 PM  Lillette Boxer. Raysa Bosak MD

## 2021-03-02 ENCOUNTER — Encounter (HOSPITAL_BASED_OUTPATIENT_CLINIC_OR_DEPARTMENT_OTHER): Payer: Self-pay | Admitting: Urology

## 2021-03-02 ENCOUNTER — Ambulatory Visit (HOSPITAL_BASED_OUTPATIENT_CLINIC_OR_DEPARTMENT_OTHER)
Admission: RE | Admit: 2021-03-02 | Discharge: 2021-03-02 | Disposition: A | Discharge status: Home / Self Care | Payer: Medicare Other | Attending: Urology | Admitting: Urology

## 2021-03-02 ENCOUNTER — Ambulatory Visit (HOSPITAL_BASED_OUTPATIENT_CLINIC_OR_DEPARTMENT_OTHER): Payer: Medicare Other | Admitting: Anesthesiology

## 2021-03-02 ENCOUNTER — Encounter (HOSPITAL_BASED_OUTPATIENT_CLINIC_OR_DEPARTMENT_OTHER)
Admission: RE | Disposition: A | Discharge status: Home / Self Care | Payer: Self-pay | Source: Home / Self Care | Attending: Urology

## 2021-03-02 ENCOUNTER — Ambulatory Visit (HOSPITAL_COMMUNITY): Payer: Medicare Other

## 2021-03-02 DIAGNOSIS — Z87442 Personal history of urinary calculi: Secondary | ICD-10-CM | POA: Insufficient documentation

## 2021-03-02 DIAGNOSIS — Z538 Procedure and treatment not carried out for other reasons: Secondary | ICD-10-CM | POA: Diagnosis not present

## 2021-03-02 DIAGNOSIS — Z9049 Acquired absence of other specified parts of digestive tract: Secondary | ICD-10-CM | POA: Diagnosis not present

## 2021-03-02 DIAGNOSIS — Z96612 Presence of left artificial shoulder joint: Secondary | ICD-10-CM | POA: Diagnosis not present

## 2021-03-02 DIAGNOSIS — Z885 Allergy status to narcotic agent status: Secondary | ICD-10-CM | POA: Insufficient documentation

## 2021-03-02 DIAGNOSIS — N201 Calculus of ureter: Secondary | ICD-10-CM | POA: Diagnosis not present

## 2021-03-02 DIAGNOSIS — N132 Hydronephrosis with renal and ureteral calculous obstruction: Secondary | ICD-10-CM

## 2021-03-02 DIAGNOSIS — M47816 Spondylosis without myelopathy or radiculopathy, lumbar region: Secondary | ICD-10-CM | POA: Diagnosis not present

## 2021-03-02 DIAGNOSIS — N200 Calculus of kidney: Secondary | ICD-10-CM | POA: Diagnosis not present

## 2021-03-02 DIAGNOSIS — Z87891 Personal history of nicotine dependence: Secondary | ICD-10-CM | POA: Diagnosis not present

## 2021-03-02 DIAGNOSIS — Z79899 Other long term (current) drug therapy: Secondary | ICD-10-CM | POA: Diagnosis not present

## 2021-03-02 HISTORY — DX: Personal history of other diseases of the circulatory system: Z86.79

## 2021-03-02 LAB — GLUCOSE, CAPILLARY: Glucose-Capillary: 118 mg/dL — ABNORMAL HIGH (ref 70–99)

## 2021-03-02 SURGERY — CANCELLED PROCEDURE
Anesthesia: General | Laterality: Left

## 2021-03-02 MED ORDER — FENTANYL CITRATE (PF) 100 MCG/2ML IJ SOLN
INTRAMUSCULAR | Status: AC
  Filled 2021-03-02: qty 2

## 2021-03-02 MED ORDER — LACTATED RINGERS IV SOLN: INTRAVENOUS | Status: DC

## 2021-03-02 MED ORDER — CEFAZOLIN SODIUM-DEXTROSE 2-4 GM/100ML-% IV SOLN
INTRAVENOUS | Status: AC
  Filled 2021-03-02: qty 100

## 2021-03-02 MED ORDER — PROPOFOL 10 MG/ML IV BOLUS
INTRAVENOUS | Status: AC
  Filled 2021-03-02: qty 20

## 2021-03-02 MED ORDER — CEFAZOLIN SODIUM-DEXTROSE 2-4 GM/100ML-% IV SOLN
2.0000 g | Freq: Once | INTRAVENOUS | Status: DC
Start: 2021-03-02 — End: 2021-03-02

## 2021-03-02 MED ORDER — LIDOCAINE HCL (PF) 2 % IJ SOLN
INTRAMUSCULAR | Status: AC
  Filled 2021-03-02: qty 5

## 2021-03-02 SURGICAL SUPPLY — 25 items
BAG DRAIN URO-CYSTO SKYTR STRL (DRAIN) IMPLANT
BASKET ZERO TIP NITINOL 2.4FR (BASKET) IMPLANT
CATH INTERMIT  6FR 70CM (CATHETERS) IMPLANT
CLOTH BEACON ORANGE TIMEOUT ST (SAFETY) IMPLANT
COVER DOME SNAP 22 D (MISCELLANEOUS) IMPLANT
ELECT REM PT RETURN 9FT ADLT (ELECTROSURGICAL)
ELECTRODE REM PT RTRN 9FT ADLT (ELECTROSURGICAL) IMPLANT
FIBER LASER FLEXIVA 200 (UROLOGICAL SUPPLIES) IMPLANT
FIBER LASER FLEXIVA 365 (UROLOGICAL SUPPLIES) IMPLANT
GLOVE SURG ENC MOIS LTX SZ8 (GLOVE) IMPLANT
GLOVE SURG UNDER POLY LF SZ7 (GLOVE) IMPLANT
GLOVE SURG UNDER POLY LF SZ7.5 (GLOVE) IMPLANT
GOWN STRL REUS W/TWL LRG LVL3 (GOWN DISPOSABLE) IMPLANT
GUIDEWIRE ANG ZIPWIRE 038X150 (WIRE) IMPLANT
GUIDEWIRE STR DUAL SENSOR (WIRE) IMPLANT
IV NS IRRIG 3000ML ARTHROMATIC (IV SOLUTION) IMPLANT
KIT TURNOVER CYSTO (KITS) IMPLANT
MANIFOLD NEPTUNE II (INSTRUMENTS) IMPLANT
NS IRRIG 500ML POUR BTL (IV SOLUTION) IMPLANT
PACK CYSTO (CUSTOM PROCEDURE TRAY) IMPLANT
SHEATH URETERAL 12FRX35CM (MISCELLANEOUS) IMPLANT
TRACTIP FLEXIVA PULS ID 200XHI (Laser) IMPLANT
TRACTIP FLEXIVA PULSE ID 200 (Laser)
TUBE CONNECTING 12X1/4 (SUCTIONS) IMPLANT
TUBING UROLOGY SET (TUBING) IMPLANT

## 2021-03-02 NOTE — Anesthesia Preprocedure Evaluation (Addendum)
Anesthesia Evaluation  Patient identified by MRN, date of birth, ID band Patient awake    Reviewed: Allergy & Precautions, NPO status , Patient's Chart, lab work & pertinent test results  History of Anesthesia Complications Negative for: history of anesthetic complications  Airway Mallampati: II  TM Distance: >3 FB Neck ROM: Full    Dental  (+) Dental Advisory Given, Teeth Intact   Pulmonary former smoker,    Pulmonary exam normal        Cardiovascular hypertension, Pt. on medications Normal cardiovascular exam     Neuro/Psych negative neurological ROS  negative psych ROS   GI/Hepatic negative GI ROS, Neg liver ROS,   Endo/Other  diabetes, Type 2, Oral Hypoglycemic Agents Obesity   Renal/GU negative Renal ROS     Musculoskeletal  (+) Arthritis ,   Abdominal   Peds  Hematology negative hematology ROS (+)   Anesthesia Other Findings   Reproductive/Obstetrics                            Anesthesia Physical Anesthesia Plan  ASA: 2  Anesthesia Plan: General   Post-op Pain Management:    Induction: Intravenous  PONV Risk Score and Plan: 2 and Treatment may vary due to age or medical condition and Ondansetron  Airway Management Planned: LMA  Additional Equipment: None  Intra-op Plan:   Post-operative Plan: Extubation in OR  Informed Consent: I have reviewed the patients History and Physical, chart, labs and discussed the procedure including the risks, benefits and alternatives for the proposed anesthesia with the patient or authorized representative who has indicated his/her understanding and acceptance.     Dental advisory given  Plan Discussed with: CRNA and Anesthesiologist  Anesthesia Plan Comments:       Anesthesia Quick Evaluation

## 2021-03-09 DIAGNOSIS — N202 Calculus of kidney with calculus of ureter: Secondary | ICD-10-CM | POA: Diagnosis not present

## 2021-03-11 DIAGNOSIS — I10 Essential (primary) hypertension: Secondary | ICD-10-CM | POA: Diagnosis not present

## 2021-03-11 DIAGNOSIS — H6120 Impacted cerumen, unspecified ear: Secondary | ICD-10-CM | POA: Diagnosis not present

## 2021-03-11 DIAGNOSIS — Z299 Encounter for prophylactic measures, unspecified: Secondary | ICD-10-CM | POA: Diagnosis not present

## 2021-03-11 DIAGNOSIS — Z6833 Body mass index (BMI) 33.0-33.9, adult: Secondary | ICD-10-CM | POA: Diagnosis not present

## 2021-03-11 DIAGNOSIS — E1165 Type 2 diabetes mellitus with hyperglycemia: Secondary | ICD-10-CM | POA: Diagnosis not present

## 2021-03-17 DIAGNOSIS — Z299 Encounter for prophylactic measures, unspecified: Secondary | ICD-10-CM | POA: Diagnosis not present

## 2021-03-17 DIAGNOSIS — Z789 Other specified health status: Secondary | ICD-10-CM | POA: Diagnosis not present

## 2021-03-17 DIAGNOSIS — H6122 Impacted cerumen, left ear: Secondary | ICD-10-CM | POA: Diagnosis not present

## 2021-03-17 DIAGNOSIS — Z6833 Body mass index (BMI) 33.0-33.9, adult: Secondary | ICD-10-CM | POA: Diagnosis not present

## 2021-03-17 DIAGNOSIS — I10 Essential (primary) hypertension: Secondary | ICD-10-CM | POA: Diagnosis not present

## 2021-04-20 DIAGNOSIS — I10 Essential (primary) hypertension: Secondary | ICD-10-CM | POA: Diagnosis not present

## 2021-04-20 DIAGNOSIS — R109 Unspecified abdominal pain: Secondary | ICD-10-CM | POA: Diagnosis not present

## 2021-04-20 DIAGNOSIS — E1165 Type 2 diabetes mellitus with hyperglycemia: Secondary | ICD-10-CM | POA: Diagnosis not present

## 2021-04-20 DIAGNOSIS — I700 Atherosclerosis of aorta: Secondary | ICD-10-CM | POA: Diagnosis not present

## 2021-04-20 DIAGNOSIS — Z299 Encounter for prophylactic measures, unspecified: Secondary | ICD-10-CM | POA: Diagnosis not present

## 2021-04-29 DIAGNOSIS — Z20828 Contact with and (suspected) exposure to other viral communicable diseases: Secondary | ICD-10-CM | POA: Diagnosis not present

## 2021-04-29 DIAGNOSIS — Z23 Encounter for immunization: Secondary | ICD-10-CM | POA: Diagnosis not present

## 2021-06-22 DIAGNOSIS — I10 Essential (primary) hypertension: Secondary | ICD-10-CM | POA: Diagnosis not present

## 2021-06-22 DIAGNOSIS — R21 Rash and other nonspecific skin eruption: Secondary | ICD-10-CM | POA: Diagnosis not present

## 2021-06-22 DIAGNOSIS — Z299 Encounter for prophylactic measures, unspecified: Secondary | ICD-10-CM | POA: Diagnosis not present

## 2021-06-22 DIAGNOSIS — Z6833 Body mass index (BMI) 33.0-33.9, adult: Secondary | ICD-10-CM | POA: Diagnosis not present

## 2021-06-22 DIAGNOSIS — U071 COVID-19: Secondary | ICD-10-CM | POA: Diagnosis not present

## 2021-07-01 DIAGNOSIS — Z7189 Other specified counseling: Secondary | ICD-10-CM | POA: Diagnosis not present

## 2021-07-01 DIAGNOSIS — Z0000 Encounter for general adult medical examination without abnormal findings: Secondary | ICD-10-CM | POA: Diagnosis not present

## 2021-07-01 DIAGNOSIS — I10 Essential (primary) hypertension: Secondary | ICD-10-CM | POA: Diagnosis not present

## 2021-07-01 DIAGNOSIS — Z125 Encounter for screening for malignant neoplasm of prostate: Secondary | ICD-10-CM | POA: Diagnosis not present

## 2021-07-01 DIAGNOSIS — E7800 Pure hypercholesterolemia, unspecified: Secondary | ICD-10-CM | POA: Diagnosis not present

## 2021-07-01 DIAGNOSIS — R5383 Other fatigue: Secondary | ICD-10-CM | POA: Diagnosis not present

## 2021-07-01 DIAGNOSIS — Z6833 Body mass index (BMI) 33.0-33.9, adult: Secondary | ICD-10-CM | POA: Diagnosis not present

## 2021-07-01 DIAGNOSIS — Z299 Encounter for prophylactic measures, unspecified: Secondary | ICD-10-CM | POA: Diagnosis not present

## 2021-07-01 DIAGNOSIS — Z79899 Other long term (current) drug therapy: Secondary | ICD-10-CM | POA: Diagnosis not present

## 2021-07-01 DIAGNOSIS — Z1339 Encounter for screening examination for other mental health and behavioral disorders: Secondary | ICD-10-CM | POA: Diagnosis not present

## 2021-07-01 DIAGNOSIS — Z1331 Encounter for screening for depression: Secondary | ICD-10-CM | POA: Diagnosis not present

## 2021-08-27 DIAGNOSIS — Z6833 Body mass index (BMI) 33.0-33.9, adult: Secondary | ICD-10-CM | POA: Diagnosis not present

## 2021-08-27 DIAGNOSIS — Z789 Other specified health status: Secondary | ICD-10-CM | POA: Diagnosis not present

## 2021-08-27 DIAGNOSIS — I700 Atherosclerosis of aorta: Secondary | ICD-10-CM | POA: Diagnosis not present

## 2021-08-27 DIAGNOSIS — E1165 Type 2 diabetes mellitus with hyperglycemia: Secondary | ICD-10-CM | POA: Diagnosis not present

## 2021-08-27 DIAGNOSIS — Z299 Encounter for prophylactic measures, unspecified: Secondary | ICD-10-CM | POA: Diagnosis not present

## 2021-08-27 DIAGNOSIS — D649 Anemia, unspecified: Secondary | ICD-10-CM | POA: Diagnosis not present

## 2021-08-27 DIAGNOSIS — E559 Vitamin D deficiency, unspecified: Secondary | ICD-10-CM | POA: Diagnosis not present

## 2021-08-27 DIAGNOSIS — E538 Deficiency of other specified B group vitamins: Secondary | ICD-10-CM | POA: Diagnosis not present

## 2021-08-27 DIAGNOSIS — I10 Essential (primary) hypertension: Secondary | ICD-10-CM | POA: Diagnosis not present

## 2021-09-09 DIAGNOSIS — M48062 Spinal stenosis, lumbar region with neurogenic claudication: Secondary | ICD-10-CM | POA: Diagnosis not present

## 2021-09-09 DIAGNOSIS — M4726 Other spondylosis with radiculopathy, lumbar region: Secondary | ICD-10-CM | POA: Diagnosis not present

## 2021-09-10 ENCOUNTER — Other Ambulatory Visit: Payer: Self-pay | Admitting: Neurological Surgery

## 2021-09-10 DIAGNOSIS — M48062 Spinal stenosis, lumbar region with neurogenic claudication: Secondary | ICD-10-CM

## 2021-09-11 DIAGNOSIS — R3915 Urgency of urination: Secondary | ICD-10-CM | POA: Diagnosis not present

## 2021-09-11 DIAGNOSIS — N200 Calculus of kidney: Secondary | ICD-10-CM | POA: Diagnosis not present

## 2021-09-21 ENCOUNTER — Other Ambulatory Visit: Payer: Self-pay

## 2021-09-21 ENCOUNTER — Ambulatory Visit
Admission: RE | Admit: 2021-09-21 | Discharge: 2021-09-21 | Disposition: A | Discharge status: Home / Self Care | Payer: Medicare Other | Source: Ambulatory Visit | Attending: Neurological Surgery | Admitting: Neurological Surgery

## 2021-09-21 DIAGNOSIS — M4316 Spondylolisthesis, lumbar region: Secondary | ICD-10-CM | POA: Diagnosis not present

## 2021-09-21 DIAGNOSIS — M5127 Other intervertebral disc displacement, lumbosacral region: Secondary | ICD-10-CM | POA: Diagnosis not present

## 2021-09-21 DIAGNOSIS — M48062 Spinal stenosis, lumbar region with neurogenic claudication: Secondary | ICD-10-CM | POA: Diagnosis not present

## 2021-09-21 DIAGNOSIS — M5126 Other intervertebral disc displacement, lumbar region: Secondary | ICD-10-CM | POA: Diagnosis not present

## 2021-09-21 MED ORDER — GADOBENATE DIMEGLUMINE 529 MG/ML IV SOLN
20.0000 mL | Freq: Once | INTRAVENOUS | Status: AC | PRN
  Administered 2021-09-21: 20 mL via INTRAVENOUS

## 2021-09-22 ENCOUNTER — Encounter: Payer: Self-pay | Admitting: Gastroenterology

## 2021-09-24 DIAGNOSIS — I10 Essential (primary) hypertension: Secondary | ICD-10-CM | POA: Diagnosis not present

## 2021-09-24 DIAGNOSIS — Z6833 Body mass index (BMI) 33.0-33.9, adult: Secondary | ICD-10-CM | POA: Diagnosis not present

## 2021-09-24 DIAGNOSIS — G20 Parkinson's disease: Secondary | ICD-10-CM | POA: Diagnosis not present

## 2021-10-01 ENCOUNTER — Ambulatory Visit (INDEPENDENT_AMBULATORY_CARE_PROVIDER_SITE_OTHER): Payer: Medicare Other | Admitting: Neurology

## 2021-10-01 ENCOUNTER — Encounter: Payer: Self-pay | Admitting: Neurology

## 2021-10-01 ENCOUNTER — Other Ambulatory Visit: Payer: Self-pay

## 2021-10-01 VITALS — BP 162/80 | HR 66 | Ht 68.0 in | Wt 222.0 lb

## 2021-10-01 DIAGNOSIS — R269 Unspecified abnormalities of gait and mobility: Secondary | ICD-10-CM | POA: Diagnosis not present

## 2021-10-01 DIAGNOSIS — G4733 Obstructive sleep apnea (adult) (pediatric): Secondary | ICD-10-CM

## 2021-10-01 DIAGNOSIS — R413 Other amnesia: Secondary | ICD-10-CM

## 2021-10-01 NOTE — Progress Notes (Signed)
? ?Chief Complaint  ?Patient presents with  ? New Patient (Initial Visit)  ?  Rm 15. Accompanied by wife. ?NP/paper/Julian Dominguez Neuro/Parkinsonism concern.  ? ? ? ? ?ASSESSMENT AND PLAN ? ?Julian Dominguez is a 81 y.o. male  ?Gait abnormality ? Likely combination of aging, deconditioning, low back pain, peripheral neuropathy ? No significant parkinsonian features ? Referred to physical therapy ?Obstructive sleep apnea ? Loud snoring, excessive daytime sleepiness, fatigue, narrow oropharyngeal space ? Referred to sleep study ? ? ?DIAGNOSTIC DATA (LABS, IMAGING, TESTING) ?- I reviewed patient records, labs, notes, testing and imaging myself where available. ? ? ?MEDICAL HISTORY: ? ?Julian Dominguez is a 81 year old male, seen in request by Dr. Kristeen Dominguez, for evaluation of gait abnormality, excessive fatigue, initial evaluation was on October 01, 2021, his primary care physician is Dr. Woody Dominguez, Julian Dominguez   ? ?I reviewed and summarized the referring note. PMhx. ?DM x 20 years ?HLD ?HTN ?Kidney stone ?Lumbar decompression, did help his low back pain, ?Right ankle fracture surgery ?Cervical decompression, right cervical radiculopathy ?Left shoulder replacement ? ?Patient [place] farm, used to be very active, raise cows, he had a gradual onset gait abnormality over past couple years, especially since September 2022, he contributed to low back pain, was noted to difficult to take a big stride, his gait is become unsteady ? ?He can still Mood in the winter, complains of excessive daytime sleepiness, and take frequent Naps, he denies bilateral hands tremor, no loss of sense of smell, no REM sleep disorder, ? ?I personally reviewed MRI of lumbar spine with without contrast September 21, 2021, progressive adjacent mild to lumbar degenerative changes, moderate to severe right foraminal stenosis at L1-2,  L2-5 PLIF ? ?PHYSICAL EXAM: ?  ?Vitals:  ? 10/01/21 0942  ?BP: (!) 162/80  ?Pulse: 66  ?Weight: 222 lb (100.7  kg)  ?Height: '5\' 8"'$  (1.727 m)  ? ?Sitting down: 153/75,66; Standing up:145/86,71; standing up in one minute: 147/89; 72 ? ? ?Body mass index is 33.75 kg/m?. ? ?PHYSICAL EXAMNIATION: ? ?Gen: NAD, conversant, well nourised, well groomed                     ?Cardiovascular: Regular rate rhythm, no peripheral edema, warm, nontender. ?Eyes: Conjunctivae clear without exudates or hemorrhage ?Neck: Supple, no carotid bruits. ?Pulmonary: Clear to auscultation bilaterally  ? ?NEUROLOGICAL EXAM: ? ?MENTAL STATUS: ?Speech: ?   Speech is normal; fluent and spontaneous with normal comprehension.  ?Cognition: ?    Orientation to time, place and person ?    Normal recent and remote memory ?    Normal Attention span and concentration ?    Normal Language, naming, repeating,spontaneous speech ?    Fund of knowledge ?  ?CRANIAL NERVES: ?CN II: Visual fields are full to confrontation. Pupils are round equal and briskly reactive to light. ?CN III, IV, VI: extraocular movement are normal. No ptosis. ?CN V: Facial sensation is intact to light touch ?CN VII: Face is symmetric with normal eye closure  ?CN VIII: Hearing is normal to causal conversation. ?CN IX, X: Phonation is normal. ?CN XI: Head turning and shoulder shrug are intact ?CN XII: Narrow oropharyngeal space ? ?MOTOR: Normal strength, no significant resting tremor, no bradykinesia or rigidity, ? ?REFLEXES: ?Reflexes are 2+ and symmetric at the biceps, triceps, knees, and ankles. Plantar responses are flexor. ? ?SENSORY: Mild length dependent sensory changes ? ?COORDINATION: ?There is no trunk or limb dysmetria noted. ? ?GAIT/STANCE:  He needs push-up to get up from seated position, cautious, mildly unsteady, limited by his low back pain, joint pain, and body habitus ? ?REVIEW OF SYSTEMS:  ?Full 14 system review of systems performed and notable only for as above ?All other review of systems were negative. ? ? ?ALLERGIES: ?Allergies  ?Allergen Reactions  ? Morphine And Related  Nausea And Vomiting  ? ? ?HOME MEDICATIONS: ?Current Outpatient Medications  ?Medication Sig Dispense Refill  ? Ascorbic Acid (VITAMIN C) 1000 MG tablet Take 1,000 mg by mouth daily.      ? aspirin 81 MG chewable tablet Chew 81 mg by mouth daily.    ? Dominguez Complex-C (SUPER Dominguez COMPLEX PO) Take 1 tablet by mouth daily.    ? BLACK ELDERBERRY PO Take 2 tablets by mouth daily.    ? diclofenac Sodium (VOLTAREN) 1 % GEL Apply topically 4 (four) times daily.    ? Garlic 9983 MG CAPS Take 2,000 mg by mouth daily.     ? glimepiride (AMARYL) 2 MG tablet Take 2 mg by mouth daily with breakfast.    ? glucosamine-chondroitin 500-400 MG tablet Take 2 tablets by mouth 3 (three) times daily.    ? ibuprofen (ADVIL) 200 MG tablet Take 400 mg by mouth every 6 (six) hours as needed for moderate pain.    ? metFORMIN HCl ER 500 MG/5ML SRER Take 500 mg by mouth 2 (two) times daily.    ? Multiple Vitamin (MULTIVITAMIN) tablet Take 1 tablet by mouth daily.      ? naproxen sodium (ALEVE) 220 MG tablet Take 220 mg by mouth.    ? Omega-3 Fatty Acids (FISH OIL) 1000 MG CPDR Take 1,200 mg by mouth daily after breakfast. 2 pills in am    ? rosuvastatin (CRESTOR) 5 MG tablet Take 5 mg by mouth once a week. monday    ? valsartan (DIOVAN) 40 MG tablet Take 40 mg by mouth daily.    ? vitamin E 400 UNIT capsule Take 400 Units by mouth daily. Takes 2 of 400 mg pills    ? ?No current facility-administered medications for this visit.  ? ? ?PAST MEDICAL HISTORY: ?Past Medical History:  ?Diagnosis Date  ? Arthritis   ? hands and back  ? Diverticulosis   ? at 04-28-11 colon   ? History of heart murmur in childhood   ? History of kidney stones   ? Hyperlipidemia   ? takes red yeast rice   ? Hypertension   ? Tubular adenoma of colon 04/28/2011  ? Type 2 dm   ? Wears hearing aid in both ears 02/25/2021  ? ? ?PAST SURGICAL HISTORY: ?Past Surgical History:  ?Procedure Laterality Date  ? ANKLE FRACTURE SURGERY Right 1996  ? hardware  ? ANTERIOR LAT LUMBAR FUSION Left  02/13/2019  ? Procedure: Left side Lumbar two-three Lumbar three-four Lumbar four-five Anterolateral decompression, posterior pedicle screw fixation;  Surgeon: Julian Miss, MD;  Location: [facility];  Service: Neurosurgery;  Laterality: Left;  ? APPLICATION OF ROBOTIC ASSISTANCE FOR SPINAL PROCEDURE N/A 02/13/2019  ? Procedure: APPLICATION OF ROBOTIC ASSISTANCE FOR SPINAL PROCEDURE;  Surgeon: Julian Miss, MD;  Location: [facility];  Service: Neurosurgery;  Laterality: N/A;  ? CATARACT EXTRACTION, BILATERAL  2011  ? CHOLECYSTECTOMY  1997  ? COLONOSCOPY  2017  ? cyst removed from finger    ? yrs ago  ? EYE SURGERY    ? bilateral cataract surgery with lens implants   ? LUMBAR PERCUTANEOUS PEDICLE SCREW 3  LEVEL N/A 02/13/2019  ? Procedure: LUMBAR PERCUTANEOUS PEDICLE SCREW LUMBAR TWO TO LUMBAR FIVE;  Surgeon: Julian Miss, MD;  Location: [facility];  Service: Neurosurgery;  Laterality: N/A;  ? NECK SURGERY  2018  ? POLYPECTOMY    ? REVERSE SHOULDER ARTHROPLASTY Left 06/26/2019  ? Procedure: REVERSE SHOULDER ARTHROPLASTY;  Surgeon: Justice Britain, MD;  Location: [facility];  Service: Orthopedics;  Laterality: Left;  148mn  ? ? ?FAMILY HISTORY: ?Family History  ?Problem Relation Age of Onset  ? Colon cancer Father 618 ? Stomach cancer Maternal Aunt   ? Colon cancer Maternal Uncle   ? Stomach cancer Maternal Grandfather 558 ? Colon cancer Cousin   ? Colon cancer Cousin   ? Colon polyps Neg Hx   ? Rectal cancer Neg Hx   ? ? ?SOCIAL HISTORY: ?Social History  ? ?Socioeconomic History  ? Marital status: Married  ?  Spouse name: Not on file  ? Number of children: Not on file  ? Years of education: Not on file  ? Highest education level: Not on file  ?Occupational History  ? Not on file  ?Tobacco Use  ? Smoking status: Former  ?  Types: Cigarettes  ?  Quit date: 04/13/1969  ?  Years since quitting: 52.5  ? Smokeless tobacco: Current  ?  Types: Chew  ?Vaping Use  ? Vaping Use: Never used  ?Substance and Sexual Activity  ? Alcohol use: No  ?  Drug use: No  ? Sexual activity: Not on file  ?Other Topics Concern  ? Not on file  ?Social History Narrative  ? Not on file  ? ?Social Determinants of Health  ? ?Financial Resource Strain: Not on file  ?Food

## 2021-10-05 ENCOUNTER — Telehealth: Payer: Self-pay | Admitting: Neurology

## 2021-10-05 NOTE — Telephone Encounter (Signed)
Patient wife called and left a voicemail that they would like to go to [facility] in [place] for his PT and not [facility]. I sent the referral. Ph #  [phone] ?

## 2021-10-07 DIAGNOSIS — R262 Difficulty in walking, not elsewhere classified: Secondary | ICD-10-CM | POA: Diagnosis not present

## 2021-10-07 DIAGNOSIS — M48062 Spinal stenosis, lumbar region with neurogenic claudication: Secondary | ICD-10-CM | POA: Diagnosis not present

## 2021-10-14 DIAGNOSIS — M48062 Spinal stenosis, lumbar region with neurogenic claudication: Secondary | ICD-10-CM | POA: Diagnosis not present

## 2021-10-14 DIAGNOSIS — R262 Difficulty in walking, not elsewhere classified: Secondary | ICD-10-CM | POA: Diagnosis not present

## 2021-10-19 DIAGNOSIS — R262 Difficulty in walking, not elsewhere classified: Secondary | ICD-10-CM | POA: Diagnosis not present

## 2021-10-19 DIAGNOSIS — M48062 Spinal stenosis, lumbar region with neurogenic claudication: Secondary | ICD-10-CM | POA: Diagnosis not present

## 2021-10-21 DIAGNOSIS — R262 Difficulty in walking, not elsewhere classified: Secondary | ICD-10-CM | POA: Diagnosis not present

## 2021-10-21 DIAGNOSIS — M48062 Spinal stenosis, lumbar region with neurogenic claudication: Secondary | ICD-10-CM | POA: Diagnosis not present

## 2021-10-26 DIAGNOSIS — R262 Difficulty in walking, not elsewhere classified: Secondary | ICD-10-CM | POA: Diagnosis not present

## 2021-10-26 DIAGNOSIS — M48062 Spinal stenosis, lumbar region with neurogenic claudication: Secondary | ICD-10-CM | POA: Diagnosis not present

## 2021-10-28 DIAGNOSIS — R262 Difficulty in walking, not elsewhere classified: Secondary | ICD-10-CM | POA: Diagnosis not present

## 2021-10-28 DIAGNOSIS — M48062 Spinal stenosis, lumbar region with neurogenic claudication: Secondary | ICD-10-CM | POA: Diagnosis not present

## 2021-11-02 DIAGNOSIS — M48062 Spinal stenosis, lumbar region with neurogenic claudication: Secondary | ICD-10-CM | POA: Diagnosis not present

## 2021-11-02 DIAGNOSIS — R262 Difficulty in walking, not elsewhere classified: Secondary | ICD-10-CM | POA: Diagnosis not present

## 2021-11-04 DIAGNOSIS — R262 Difficulty in walking, not elsewhere classified: Secondary | ICD-10-CM | POA: Diagnosis not present

## 2021-11-04 DIAGNOSIS — M48062 Spinal stenosis, lumbar region with neurogenic claudication: Secondary | ICD-10-CM | POA: Diagnosis not present

## 2021-11-09 DIAGNOSIS — M48062 Spinal stenosis, lumbar region with neurogenic claudication: Secondary | ICD-10-CM | POA: Diagnosis not present

## 2021-11-09 DIAGNOSIS — R262 Difficulty in walking, not elsewhere classified: Secondary | ICD-10-CM | POA: Diagnosis not present

## 2021-11-11 DIAGNOSIS — R262 Difficulty in walking, not elsewhere classified: Secondary | ICD-10-CM | POA: Diagnosis not present

## 2021-11-11 DIAGNOSIS — M48062 Spinal stenosis, lumbar region with neurogenic claudication: Secondary | ICD-10-CM | POA: Diagnosis not present

## 2021-11-13 ENCOUNTER — Ambulatory Visit: Payer: Medicare Other | Admitting: Neurology

## 2021-11-16 DIAGNOSIS — M48062 Spinal stenosis, lumbar region with neurogenic claudication: Secondary | ICD-10-CM | POA: Diagnosis not present

## 2021-11-16 DIAGNOSIS — R262 Difficulty in walking, not elsewhere classified: Secondary | ICD-10-CM | POA: Diagnosis not present

## 2021-11-18 DIAGNOSIS — M48062 Spinal stenosis, lumbar region with neurogenic claudication: Secondary | ICD-10-CM | POA: Diagnosis not present

## 2021-11-18 DIAGNOSIS — R262 Difficulty in walking, not elsewhere classified: Secondary | ICD-10-CM | POA: Diagnosis not present

## 2021-11-23 DIAGNOSIS — R262 Difficulty in walking, not elsewhere classified: Secondary | ICD-10-CM | POA: Diagnosis not present

## 2021-11-23 DIAGNOSIS — M48062 Spinal stenosis, lumbar region with neurogenic claudication: Secondary | ICD-10-CM | POA: Diagnosis not present

## 2021-12-01 DIAGNOSIS — Z299 Encounter for prophylactic measures, unspecified: Secondary | ICD-10-CM | POA: Diagnosis not present

## 2021-12-01 DIAGNOSIS — E1129 Type 2 diabetes mellitus with other diabetic kidney complication: Secondary | ICD-10-CM | POA: Diagnosis not present

## 2021-12-01 DIAGNOSIS — E1165 Type 2 diabetes mellitus with hyperglycemia: Secondary | ICD-10-CM | POA: Diagnosis not present

## 2021-12-01 DIAGNOSIS — I10 Essential (primary) hypertension: Secondary | ICD-10-CM | POA: Diagnosis not present

## 2021-12-01 DIAGNOSIS — I700 Atherosclerosis of aorta: Secondary | ICD-10-CM | POA: Diagnosis not present

## 2022-01-20 ENCOUNTER — Ambulatory Visit: Payer: Medicare Other | Admitting: Gastroenterology

## 2022-03-03 ENCOUNTER — Other Ambulatory Visit (INDEPENDENT_AMBULATORY_CARE_PROVIDER_SITE_OTHER): Payer: Medicare Other

## 2022-03-03 ENCOUNTER — Encounter: Payer: Self-pay | Admitting: Gastroenterology

## 2022-03-03 ENCOUNTER — Ambulatory Visit (INDEPENDENT_AMBULATORY_CARE_PROVIDER_SITE_OTHER): Payer: Medicare Other | Admitting: Gastroenterology

## 2022-03-03 VITALS — BP 182/110 | HR 72 | Ht 65.0 in | Wt 217.0 lb

## 2022-03-03 DIAGNOSIS — Z800 Family history of malignant neoplasm of digestive organs: Secondary | ICD-10-CM

## 2022-03-03 DIAGNOSIS — D649 Anemia, unspecified: Secondary | ICD-10-CM

## 2022-03-03 DIAGNOSIS — Z85038 Personal history of other malignant neoplasm of large intestine: Secondary | ICD-10-CM

## 2022-03-03 LAB — CBC WITH DIFFERENTIAL/PLATELET
Basophils Absolute: 0.1 10*3/uL (ref 0.0–0.1)
Basophils Relative: 0.7 % (ref 0.0–3.0)
Eosinophils Absolute: 0.3 10*3/uL (ref 0.0–0.7)
Eosinophils Relative: 3.9 % (ref 0.0–5.0)
HCT: 38.3 % — ABNORMAL LOW (ref 39.0–52.0)
Hemoglobin: 12.7 g/dL — ABNORMAL LOW (ref 13.0–17.0)
Lymphocytes Relative: 21.6 % (ref 12.0–46.0)
Lymphs Abs: 1.9 10*3/uL (ref 0.7–4.0)
MCHC: 33.3 g/dL (ref 30.0–36.0)
MCV: 92.1 fl (ref 78.0–100.0)
Monocytes Absolute: 0.7 10*3/uL (ref 0.1–1.0)
Monocytes Relative: 7.7 % (ref 3.0–12.0)
Neutro Abs: 5.8 10*3/uL (ref 1.4–7.7)
Neutrophils Relative %: 66.1 % (ref 43.0–77.0)
Platelets: 193 10*3/uL (ref 150.0–400.0)
RBC: 4.15 Mil/uL — ABNORMAL LOW (ref 4.22–5.81)
RDW: 13.7 % (ref 11.5–15.5)
WBC: 8.8 10*3/uL (ref 4.0–10.5)

## 2022-03-03 LAB — IBC + FERRITIN
Ferritin: 86.4 ng/mL (ref 22.0–322.0)
Iron: 88 ug/dL (ref 42–165)
Saturation Ratios: 21.6 % (ref 20.0–50.0)
TIBC: 407.4 ug/dL (ref 250.0–450.0)
Transferrin: 291 mg/dL (ref 212.0–360.0)

## 2022-03-03 LAB — B12 AND FOLATE PANEL
Folate: >24.2 ng/mL (ref >5.9)
Vitamin B-12: 405 pg/mL (ref 211–911)

## 2022-03-03 MED ORDER — NA SULFATE-K SULFATE-MG SULF 17.5-3.13-1.6 GM/177ML PO SOLN: 1.0000 | Freq: Once | ORAL | 0 refills | Status: AC

## 2022-03-03 NOTE — Progress Notes (Unsigned)
03/03/2022 Dominick Morella [id number] 07/08/1941   HISTORY OF PRESENT ILLNESS: ***    - Diverticulosis in the left colon. - Internal hemorrhoids. - The examination was otherwise normal on direct and retroflexion views. - No specimens collected.   Past Medical History:  Diagnosis Date   Arthritis    hands and back   Diverticulosis    at 04-28-11 colon    History of heart murmur in childhood    History of kidney stones    Hyperlipidemia    takes red yeast rice    Hypertension    Tubular adenoma of colon 04/28/2011   Type 2 dm    Wears hearing aid in both ears 02/25/2021   Past Surgical History:  Procedure Laterality Date   ANKLE FRACTURE SURGERY Right 1996   hardware   ANTERIOR LAT LUMBAR FUSION Left 02/13/2019   Procedure: Left side Lumbar two-three Lumbar three-four Lumbar four-five Anterolateral decompression, posterior pedicle screw fixation;  Surgeon: Kristeen Miss, MD;  Location: [facility];  Service: Neurosurgery;  Laterality: Left;   APPLICATION OF ROBOTIC ASSISTANCE FOR SPINAL PROCEDURE N/A 02/13/2019   Procedure: APPLICATION OF ROBOTIC ASSISTANCE FOR SPINAL PROCEDURE;  Surgeon: Kristeen Miss, MD;  Location: [facility];  Service: Neurosurgery;  Laterality: N/A;   CATARACT EXTRACTION, BILATERAL  2011   CHOLECYSTECTOMY  1997   COLONOSCOPY  2017   cyst removed from finger     yrs ago   EYE SURGERY     bilateral cataract surgery with lens implants    LUMBAR PERCUTANEOUS PEDICLE SCREW 3 LEVEL N/A 02/13/2019   Procedure: LUMBAR PERCUTANEOUS PEDICLE SCREW LUMBAR TWO TO LUMBAR FIVE;  Surgeon: Kristeen Miss, MD;  Location: [facility];  Service: Neurosurgery;  Laterality: N/A;   NECK SURGERY  2018   POLYPECTOMY     REVERSE SHOULDER ARTHROPLASTY Left 06/26/2019   Procedure: REVERSE SHOULDER ARTHROPLASTY;  Surgeon: Justice Britain, MD;  Location: [facility];  Service: Orthopedics;  Laterality: Left;  133mn    reports that he quit smoking about 52 years ago. His smoking use  included cigarettes. His smokeless tobacco use includes chew. He reports that he does not drink alcohol and does not use drugs. family history includes Colon cancer in his cousin, cousin, and maternal uncle; Colon cancer (age of onset: 617 in his father; Stomach cancer in his maternal aunt; Stomach cancer (age of onset: 512 in his maternal grandfather. Allergies  Allergen Reactions   Morphine And Related Nausea And Vomiting      Outpatient Encounter Medications as of 03/03/2022  Medication Sig   Ascorbic Acid (VITAMIN C) 1000 MG tablet Take 1,000 mg by mouth daily.     aspirin 81 MG chewable tablet Chew 81 mg by mouth daily.   Garlic 10102MG CAPS Take 2,000 mg by mouth daily.    glimepiride (AMARYL) 2 MG tablet Take 2 mg by mouth daily with breakfast.   glucosamine-chondroitin 500-400 MG tablet Take 2 tablets by mouth 3 (three) times daily.   metFORMIN HCl ER 500 MG/5ML SRER Take 500 mg by mouth 2 (two) times daily.   Multiple Vitamin (MULTIVITAMIN) tablet Take 1 tablet by mouth daily.     Omega-3 Fatty Acids (FISH OIL) 1000 MG CPDR Take 1,200 mg by mouth daily after breakfast. 2 pills in am   rosuvastatin (CRESTOR) 5 MG tablet Take 5 mg by mouth once a week. monday   valsartan (DIOVAN) 40 MG tablet Take 40 mg by mouth daily.   vitamin E 400  UNIT capsule Take 400 Units by mouth daily. Takes 2 of 400 mg pills   B Complex-C (SUPER B COMPLEX PO) Take 1 tablet by mouth daily. (Patient not taking: Reported on 03/03/2022)   BLACK ELDERBERRY PO Take 2 tablets by mouth daily. (Patient not taking: Reported on 03/03/2022)   diclofenac Sodium (VOLTAREN) 1 % GEL Apply topically 4 (four) times daily. (Patient not taking: Reported on 03/03/2022)   ibuprofen (ADVIL) 200 MG tablet Take 400 mg by mouth every 6 (six) hours as needed for moderate pain. (Patient not taking: Reported on 03/03/2022)   naproxen sodium (ALEVE) 220 MG tablet Take 220 mg by mouth. (Patient not taking: Reported on 03/03/2022)   No  facility-administered encounter medications on file as of 03/03/2022.     REVIEW OF SYSTEMS  : All other systems reviewed and negative except where noted in the History of Present Illness.   PHYSICAL EXAM: BP (!) 182/110   Pulse 72   Ht '5\' 5"'$  (1.651 m)   Wt 217 lb (98.4 kg)   SpO2 98%   BMI 36.11 kg/m  General: Well developed white male in no acute distress Head: Normocephalic and atraumatic Eyes:  sclerae anicteric,conjunctive pink. Ears: Normal auditory acuity Neck: Supple, no masses.  Lungs: Clear throughout to auscultation Heart: Regular rate and rhythm Abdomen: Soft, nontender, non distended. No masses or hepatomegaly noted. Normal bowel sounds Rectal: *** Musculoskeletal: Symmetrical with no gross deformities  Skin: No lesions on visible extremities Extremities: No edema  Neurological: Alert oriented x 4, grossly nonfocal Psychological:  Alert and cooperative. Normal mood and affect  ASSESSMENT AND PLAN:    CC:  Glenda Chroman, MD

## 2022-03-03 NOTE — Patient Instructions (Addendum)
Follow up with PCP about blood pressure.  Your provider has requested that you go to the basement level for lab work before leaving today. Press "B" on the elevator. The lab is located at the first door on the left as you exit the elevator.   You have been scheduled for a colonoscopy. Please follow written instructions given to you at your visit today.  Please pick up your prep supplies at the pharmacy within the next 1-3 days. If you use inhalers (even only as needed), please bring them with you on the day of your procedure. _______________________________________________________  If you are age 72 or older, your body mass index should be between 23-30. Your Body mass index is 36.11 kg/m. If this is out of the aforementioned range listed, please consider follow up with your Primary Care Provider.  If you are age 60 or younger, your body mass index should be between 19-25. Your Body mass index is 36.11 kg/m. If this is out of the aformentioned range listed, please consider follow up with your Primary Care Provider.   ________________________________________________________  The [facility] providers would like to encourage you to use [facility] to communicate with providers for non-urgent requests or questions.  Due to long hold times on the telephone, sending your provider a message by [facility] may be a faster and more efficient way to get a response.  Please allow 48 business hours for a response.  Please remember that this is for non-urgent requests.  _______________________________________________________

## 2022-03-04 ENCOUNTER — Encounter: Payer: Self-pay | Admitting: Gastroenterology

## 2022-03-04 DIAGNOSIS — Z85038 Personal history of other malignant neoplasm of large intestine: Secondary | ICD-10-CM | POA: Insufficient documentation

## 2022-03-04 DIAGNOSIS — D649 Anemia, unspecified: Secondary | ICD-10-CM | POA: Insufficient documentation

## 2022-03-05 NOTE — Progress Notes (Signed)
____________________________________________________________  Attending physician addendum:  Thank you for sending this case to me. I have reviewed the entire note and agree with the plan.  Thank you for getting repeat labs on him.  Hemoglobin 12.7, iron, B12 and folate normal. He has, his blood pressure needs better control before his colonoscopy.  Wilfrid Lund, MD  ____________________________________________________________

## 2022-03-09 DIAGNOSIS — I700 Atherosclerosis of aorta: Secondary | ICD-10-CM | POA: Diagnosis not present

## 2022-03-09 DIAGNOSIS — Z299 Encounter for prophylactic measures, unspecified: Secondary | ICD-10-CM | POA: Diagnosis not present

## 2022-03-09 DIAGNOSIS — I10 Essential (primary) hypertension: Secondary | ICD-10-CM | POA: Diagnosis not present

## 2022-03-09 DIAGNOSIS — E1165 Type 2 diabetes mellitus with hyperglycemia: Secondary | ICD-10-CM | POA: Diagnosis not present

## 2022-03-18 DIAGNOSIS — I10 Essential (primary) hypertension: Secondary | ICD-10-CM | POA: Diagnosis not present

## 2022-03-18 DIAGNOSIS — E119 Type 2 diabetes mellitus without complications: Secondary | ICD-10-CM | POA: Diagnosis not present

## 2022-04-05 DIAGNOSIS — G459 Transient cerebral ischemic attack, unspecified: Secondary | ICD-10-CM | POA: Diagnosis not present

## 2022-04-05 DIAGNOSIS — I10 Essential (primary) hypertension: Secondary | ICD-10-CM | POA: Diagnosis not present

## 2022-04-05 DIAGNOSIS — Z299 Encounter for prophylactic measures, unspecified: Secondary | ICD-10-CM | POA: Diagnosis not present

## 2022-04-06 ENCOUNTER — Encounter: Payer: Medicare Other | Admitting: Gastroenterology

## 2022-04-08 ENCOUNTER — Other Ambulatory Visit: Payer: Self-pay | Admitting: Internal Medicine

## 2022-04-08 ENCOUNTER — Other Ambulatory Visit (HOSPITAL_COMMUNITY): Payer: Self-pay | Admitting: Internal Medicine

## 2022-04-08 DIAGNOSIS — G459 Transient cerebral ischemic attack, unspecified: Secondary | ICD-10-CM

## 2022-04-11 ENCOUNTER — Ambulatory Visit (HOSPITAL_COMMUNITY)
Admission: RE | Admit: 2022-04-11 | Discharge: 2022-04-11 | Disposition: A | Discharge status: Home / Self Care | Payer: Medicare Other | Source: Ambulatory Visit | Attending: Internal Medicine | Admitting: Internal Medicine

## 2022-04-11 DIAGNOSIS — G459 Transient cerebral ischemic attack, unspecified: Secondary | ICD-10-CM | POA: Diagnosis not present

## 2022-04-11 DIAGNOSIS — G319 Degenerative disease of nervous system, unspecified: Secondary | ICD-10-CM | POA: Diagnosis not present

## 2022-04-12 DIAGNOSIS — G459 Transient cerebral ischemic attack, unspecified: Secondary | ICD-10-CM | POA: Diagnosis not present

## 2022-05-05 DIAGNOSIS — Z23 Encounter for immunization: Secondary | ICD-10-CM | POA: Diagnosis not present

## 2022-05-19 ENCOUNTER — Encounter: Payer: Self-pay | Admitting: Gastroenterology

## 2022-06-02 ENCOUNTER — Ambulatory Visit (AMBULATORY_SURGERY_CENTER): Payer: Self-pay

## 2022-06-02 VITALS — Ht 67.0 in | Wt 220.0 lb

## 2022-06-02 DIAGNOSIS — Z800 Family history of malignant neoplasm of digestive organs: Secondary | ICD-10-CM

## 2022-06-02 DIAGNOSIS — Z86010 Personal history of colonic polyps: Secondary | ICD-10-CM

## 2022-06-02 NOTE — Progress Notes (Signed)

## 2022-06-14 DIAGNOSIS — I152 Hypertension secondary to endocrine disorders: Secondary | ICD-10-CM | POA: Diagnosis not present

## 2022-06-14 DIAGNOSIS — Z299 Encounter for prophylactic measures, unspecified: Secondary | ICD-10-CM | POA: Diagnosis not present

## 2022-06-14 DIAGNOSIS — E1159 Type 2 diabetes mellitus with other circulatory complications: Secondary | ICD-10-CM | POA: Diagnosis not present

## 2022-06-14 DIAGNOSIS — I700 Atherosclerosis of aorta: Secondary | ICD-10-CM | POA: Diagnosis not present

## 2022-06-14 DIAGNOSIS — I10 Essential (primary) hypertension: Secondary | ICD-10-CM | POA: Diagnosis not present

## 2022-06-28 ENCOUNTER — Encounter: Payer: Medicare Other | Admitting: Gastroenterology

## 2022-07-05 ENCOUNTER — Encounter: Payer: Self-pay | Admitting: Neurology

## 2022-07-05 ENCOUNTER — Ambulatory Visit (INDEPENDENT_AMBULATORY_CARE_PROVIDER_SITE_OTHER): Payer: Medicare Other | Admitting: Neurology

## 2022-07-05 ENCOUNTER — Telehealth: Payer: Self-pay | Admitting: Neurology

## 2022-07-05 VITALS — BP 151/77 | HR 71 | Ht 68.0 in | Wt 223.5 lb

## 2022-07-05 DIAGNOSIS — R32 Unspecified urinary incontinence: Secondary | ICD-10-CM | POA: Diagnosis not present

## 2022-07-05 DIAGNOSIS — R4189 Other symptoms and signs involving cognitive functions and awareness: Secondary | ICD-10-CM

## 2022-07-05 DIAGNOSIS — R269 Unspecified abnormalities of gait and mobility: Secondary | ICD-10-CM

## 2022-07-05 DIAGNOSIS — F03B0 Unspecified dementia, moderate, without behavioral disturbance, psychotic disturbance, mood disturbance, and anxiety: Secondary | ICD-10-CM | POA: Insufficient documentation

## 2022-07-05 MED ORDER — OXYBUTYNIN CHLORIDE ER 5 MG PO TB24: 5.0000 mg | ORAL_TABLET | Freq: Every day | ORAL | 6 refills | Status: DC

## 2022-07-05 NOTE — Progress Notes (Signed)
Chief Complaint  Patient presents with   Follow-up    Rm 15. Accompanied by wife. Walking has improved today, but has been worsening. C/o bladder issues. His wife reports incident of word finding difficulty in September.      ASSESSMENT AND PLAN  Julian Dominguez is a 81 y.o. male  Gait abnormality Worsening urinary urgency, incontinence,  Continue to progress steadily,  Reviewed MRI of the brain September 2023, mild generalized atrophy, relative ventriculomegaly,  Could not rule out the possibility of normal pressure hydrocephalus, wife is very frustrated about continued progression especially incontinence, desire further evaluation, will proceed with large-volume lumbar puncture, to see if he has any progress, will see patient before and after lumbar puncture, MoCA, gait evaluation  Likely combination of aging, deconditioning, low back pain, peripheral neuropathy     DIAGNOSTIC DATA (LABS, IMAGING, TESTING) - I reviewed patient records, labs, notes, testing and imaging myself where available.   MEDICAL HISTORY:  Julian Dominguez is a 81 year old male, seen in request by Dr. Kristeen Miss, for evaluation of gait abnormality, excessive fatigue, initial evaluation was on October 01, 2021, his primary care physician is Dr. Woody Seller, [facility]    I reviewed and summarized the referring note. PMhx. DM x 20 years HLD HTN Kidney stone Lumbar decompression, did help his low back pain, Right ankle fracture surgery Cervical decompression, right cervical radiculopathy Left shoulder replacement  [place], used to be very active, raise cows, he had a gradual onset gait abnormality over past couple years, especially since September 2022, he contributed to low back pain, was noted to difficult to take a big stride, his gait is become unsteady  He is more sedentary in the winter, complains of excessive daytime sleepiness, and take frequent Naps, he denies bilateral hands  tremor, no loss of sense of smell, no REM sleep disorder,  I personally reviewed MRI of lumbar spine with without contrast September 21, 2021, progressive adjacent mild to lumbar degenerative changes, moderate to severe right foraminal stenosis at L1-2,  L2-5 PLIF  UPDATE Jul 05 2022: He is accompanied by his wife, reports slow progression, worsening gait abnormality, has less control of his legs, fell few times, fairly symmetric bilateral leg difficulty, denies upper extremity involvement  Wife also reported that he has excessive daytime sleepiness, difficulty relaxing and sleep at nighttime,  He also had increased bladder accident,   PHYSICAL EXAM:   Vitals:   07/05/22 1035  BP: (!) 151/77  Pulse: 71  Weight: 223 lb 8 oz (101.4 kg)  Height: '5\' 8"'$  (1.727 m)   Sitting down: 153/75,66; Standing up:145/86,71; standing up in one minute: 147/89; 72   Body mass index is 33.98 kg/m.  PHYSICAL EXAMNIATION:  Gen: NAD, conversant, well nourised, well groomed                     Cardiovascular: Regular rate rhythm, no peripheral edema, warm, nontender. Eyes: Conjunctivae clear without exudates or hemorrhage Neck: Supple, no carotid bruits. Pulmonary: Clear to auscultation bilaterally   NEUROLOGICAL EXAM:  MENTAL STATUS: Speech/Cognition:     07/05/2022   10:37 AM 10/01/2021   10:27 AM  Montreal Cognitive Assessment   Visuospatial/ Executive (0/5) 1 1  Naming (0/3) 3 3  Attention: Read list of digits (0/2) 0 1  Attention: Read list of letters (0/1) 1 1  Attention: Serial 7 subtraction starting at 100 (0/3) 0 1  Language: Repeat phrase (0/2) 2 2  Language :  Fluency (0/1) 0 0  Abstraction (0/2) 2 0  Delayed Recall (0/5) 2 1  Orientation (0/6) 5 6  Total 16 16  Adjusted Score (based on education) 16 16      CRANIAL NERVES: CN II: Visual fields are full to confrontation. Pupils are round equal and briskly reactive to light. CN III, IV, VI: extraocular movement are normal. No  ptosis. CN V: Facial sensation is intact to light touch CN VII: Face is symmetric with normal eye closure  CN VIII: Hearing is normal to causal conversation. CN IX, X: Phonation is normal. CN XI: Head turning and shoulder shrug are intact CN XII: Narrow oropharyngeal space  MOTOR: Normal strength, no significant resting tremor, no bradykinesia or rigidity,  REFLEXES: Reflexes are 2+ and symmetric at the biceps, triceps, knees, and ankles. Plantar responses are flexor.  SENSORY: Mild length dependent sensory changes  COORDINATION: There is no trunk or limb dysmetria noted.  GAIT/STANCE: He needs push-up to get up from seated position, cautious, mildly unsteady, wide-based, limited by his low back pain, joint pain, and body habitus  REVIEW OF SYSTEMS:  Full 14 system review of systems performed and notable only for as above All other review of systems were negative.   ALLERGIES: Allergies  Allergen Reactions   Morphine And Related Nausea And Vomiting    HOME MEDICATIONS: Current Outpatient Medications  Medication Sig Dispense Refill   Ascorbic Acid (VITAMIN C) 1000 MG tablet Take 1,000 mg by mouth daily.       aspirin 81 MG chewable tablet Chew 81 mg by mouth daily.     B Complex-C (SUPER B COMPLEX PO) Take 1 tablet by mouth daily.     diclofenac Sodium (VOLTAREN) 1 % GEL Apply topically as needed.     Garlic 6811 MG CAPS Take 2,000 mg by mouth daily.      glimepiride (AMARYL) 2 MG tablet Take 2 mg by mouth daily with breakfast.     glucosamine-chondroitin 500-400 MG tablet Take 2 tablets by mouth daily.     ibuprofen (ADVIL) 200 MG tablet Take 400 mg by mouth every 6 (six) hours as needed for moderate pain.     metFORMIN HCl ER 500 MG/5ML SRER Take 500 mg by mouth 2 (two) times daily.     Multiple Vitamin (MULTIVITAMIN) tablet Take 1 tablet by mouth daily.       naproxen sodium (ALEVE) 220 MG tablet Take 220 mg by mouth.     Omega-3 Fatty Acids (FISH OIL) 1000 MG CPDR Take  1,200 mg by mouth daily after breakfast. 2 pills in am     rosuvastatin (CRESTOR) 5 MG tablet Take 5 mg by mouth once a week. monday     valsartan (DIOVAN) 40 MG tablet Take 40 mg by mouth daily.     vitamin E 400 UNIT capsule Take 400 Units by mouth daily.     No current facility-administered medications for this visit.    PAST MEDICAL HISTORY: Past Medical History:  Diagnosis Date   Arthritis    hands and back   Cataract    Diverticulosis    at 04-28-11 colon    GERD (gastroesophageal reflux disease)    History of heart murmur in childhood    History of kidney stones    Hyperlipidemia    takes red yeast rice    Hypertension    Tubular adenoma of colon 04/28/2011   Type 2 dm    Wears hearing aid in both ears 02/25/2021  PAST SURGICAL HISTORY: Past Surgical History:  Procedure Laterality Date   ANKLE FRACTURE SURGERY Right 1996   hardware   ANTERIOR LAT LUMBAR FUSION Left 02/13/2019   Procedure: Left side Lumbar two-three Lumbar three-four Lumbar four-five Anterolateral decompression, posterior pedicle screw fixation;  Surgeon: Kristeen Miss, MD;  Location: [facility];  Service: Neurosurgery;  Laterality: Left;   APPLICATION OF ROBOTIC ASSISTANCE FOR SPINAL PROCEDURE N/A 02/13/2019   Procedure: APPLICATION OF ROBOTIC ASSISTANCE FOR SPINAL PROCEDURE;  Surgeon: Kristeen Miss, MD;  Location: [facility];  Service: Neurosurgery;  Laterality: N/A;   CATARACT EXTRACTION, BILATERAL  2011   CHOLECYSTECTOMY  1997   COLONOSCOPY  2017   cyst removed from finger     yrs ago   EYE SURGERY     bilateral cataract surgery with lens implants    LUMBAR PERCUTANEOUS PEDICLE SCREW 3 LEVEL N/A 02/13/2019   Procedure: LUMBAR PERCUTANEOUS PEDICLE SCREW LUMBAR TWO TO LUMBAR FIVE;  Surgeon: Kristeen Miss, MD;  Location: [facility];  Service: Neurosurgery;  Laterality: N/A;   NECK SURGERY  2018   POLYPECTOMY     REVERSE SHOULDER ARTHROPLASTY Left 06/26/2019   Procedure: REVERSE SHOULDER ARTHROPLASTY;   Surgeon: Justice Britain, MD;  [facility];  Service: Orthopedics;  Laterality: Left;  133mn    FAMILY HISTORY: Family History  Problem Relation Age of Onset   Colon cancer Father 649  Stomach cancer Maternal Grandfather 537  Stomach cancer Maternal Aunt    Colon cancer Maternal Uncle    Colon cancer Cousin    Colon cancer Cousin    Colon polyps Neg Hx    Rectal cancer Neg Hx    Esophageal cancer Neg Hx     SOCIAL HISTORY: Social History   Socioeconomic History   Marital status: Married    Spouse name: Not on file   Number of children: 2   Years of education: Not on file   Highest education level: Not on file  Occupational History   Occupation: Retired  Tobacco Use   Smoking status: Former    Types: Cigarettes    Quit date: 04/13/1969    Years since quitting: 53.2   Smokeless tobacco: Current    Types: Chew  Vaping Use   Vaping Use: Never used  Substance and Sexual Activity   Alcohol use: No   Drug use: No   Sexual activity: Not on file  Other Topics Concern   Not on file  Social History Narrative   Not on file   Social Determinants of Health   Financial Resource Strain: Not on file  Food Insecurity: Not on file  Transportation Needs: Not on file  Physical Activity: Not on file  Stress: Not on file  Social Connections: Not on file  Intimate Partner Violence: Not on file      YMarcial Pacas M.D. Ph.D.  [facility] [address] [place] [id number]: [phone]: [phone] CC:  VGlenda Chroman MD [address]  [place] [address] VGlenda Chroman MD

## 2022-07-05 NOTE — Telephone Encounter (Addendum)
I am referring him for LP for possible normal pressure hydrocephalus,  Please give him appointment, so I can or NP seeing him before and right after lumbar puncture, to see if there is any change with the large-volume lumbar puncture  We need to do MOCA, check his gait before and right after lumbar puncture, please instruct him back to clinic after he is done with his LP at [facility].  He need to see [place] before his LP too (the same day)

## 2022-07-05 NOTE — Telephone Encounter (Signed)
See Epic Last MRI brain was in Sept 2023  IMPRESSION: Negative for acute infarct   Atrophy and mild white matter changes. Chronic microhemorrhage left frontal lobe     Electronically Signed   By: Franchot Gallo M.D.   On: 04/13/2022 13:53

## 2022-07-05 NOTE — Telephone Encounter (Signed)
From [facility] regarding LP: Pt last ct head 2009. Will need prev CT or MRI Head prior to scheduling.

## 2022-07-06 DIAGNOSIS — Z125 Encounter for screening for malignant neoplasm of prostate: Secondary | ICD-10-CM | POA: Diagnosis not present

## 2022-07-06 DIAGNOSIS — Z1331 Encounter for screening for depression: Secondary | ICD-10-CM | POA: Diagnosis not present

## 2022-07-06 DIAGNOSIS — E7800 Pure hypercholesterolemia, unspecified: Secondary | ICD-10-CM | POA: Diagnosis not present

## 2022-07-06 DIAGNOSIS — R5383 Other fatigue: Secondary | ICD-10-CM | POA: Diagnosis not present

## 2022-07-06 DIAGNOSIS — Z1339 Encounter for screening examination for other mental health and behavioral disorders: Secondary | ICD-10-CM | POA: Diagnosis not present

## 2022-07-06 DIAGNOSIS — I10 Essential (primary) hypertension: Secondary | ICD-10-CM | POA: Diagnosis not present

## 2022-07-06 DIAGNOSIS — Z0000 Encounter for general adult medical examination without abnormal findings: Secondary | ICD-10-CM | POA: Diagnosis not present

## 2022-07-06 DIAGNOSIS — G319 Degenerative disease of nervous system, unspecified: Secondary | ICD-10-CM | POA: Diagnosis not present

## 2022-07-06 DIAGNOSIS — Z6833 Body mass index (BMI) 33.0-33.9, adult: Secondary | ICD-10-CM | POA: Diagnosis not present

## 2022-07-06 DIAGNOSIS — Z7189 Other specified counseling: Secondary | ICD-10-CM | POA: Diagnosis not present

## 2022-07-06 DIAGNOSIS — Z299 Encounter for prophylactic measures, unspecified: Secondary | ICD-10-CM | POA: Diagnosis not present

## 2022-07-06 DIAGNOSIS — F17210 Nicotine dependence, cigarettes, uncomplicated: Secondary | ICD-10-CM | POA: Diagnosis not present

## 2022-07-06 DIAGNOSIS — Z79899 Other long term (current) drug therapy: Secondary | ICD-10-CM | POA: Diagnosis not present

## 2022-07-07 NOTE — Telephone Encounter (Signed)
I let GI know about the MRI done 9/23. I sent the MR lumbar spine to them as well. medicare/BCBS sup NPR sent to GI [phone]

## 2022-07-14 ENCOUNTER — Telehealth: Payer: Self-pay

## 2022-07-14 ENCOUNTER — Ambulatory Visit
Admission: RE | Admit: 2022-07-14 | Discharge: 2022-07-14 | Disposition: A | Discharge status: Home / Self Care | Payer: Medicare Other | Source: Ambulatory Visit | Attending: Neurology | Admitting: Neurology

## 2022-07-14 DIAGNOSIS — R4189 Other symptoms and signs involving cognitive functions and awareness: Secondary | ICD-10-CM

## 2022-07-14 DIAGNOSIS — R32 Unspecified urinary incontinence: Secondary | ICD-10-CM

## 2022-07-14 DIAGNOSIS — R269 Unspecified abnormalities of gait and mobility: Secondary | ICD-10-CM

## 2022-07-14 NOTE — Telephone Encounter (Signed)
I received call from [facility], pt scheduled for LP today but did not complete f/u pre LP as directed with Dr. Krista Blue. I advised we were unaware the pt scheduled for today.   [facility] will r/s this appt.   Appt for today will be r/s for a time once Dr. Krista Blue is back from vacation after 07/26/2022.  I will reach out to the pt beginning of next week for f/u on this.

## 2022-07-14 NOTE — Discharge Instructions (Signed)

## 2022-07-23 DIAGNOSIS — T161XXA Foreign body in right ear, initial encounter: Secondary | ICD-10-CM | POA: Diagnosis not present

## 2022-07-23 DIAGNOSIS — Z299 Encounter for prophylactic measures, unspecified: Secondary | ICD-10-CM | POA: Diagnosis not present

## 2022-07-23 DIAGNOSIS — I10 Essential (primary) hypertension: Secondary | ICD-10-CM | POA: Diagnosis not present

## 2022-07-23 DIAGNOSIS — G319 Degenerative disease of nervous system, unspecified: Secondary | ICD-10-CM | POA: Diagnosis not present

## 2022-07-23 DIAGNOSIS — I700 Atherosclerosis of aorta: Secondary | ICD-10-CM | POA: Diagnosis not present

## 2022-07-27 ENCOUNTER — Other Ambulatory Visit: Payer: Medicare Other

## 2022-08-12 ENCOUNTER — Other Ambulatory Visit: Payer: Self-pay

## 2022-08-12 ENCOUNTER — Emergency Department (HOSPITAL_COMMUNITY)
Admission: EM | Admit: 2022-08-12 | Discharge: 2022-08-12 | Disposition: A | Discharge status: Home / Self Care | Payer: Medicare Other | Attending: Emergency Medicine | Admitting: Emergency Medicine

## 2022-08-12 ENCOUNTER — Encounter (HOSPITAL_COMMUNITY): Payer: Self-pay | Admitting: *Deleted

## 2022-08-12 ENCOUNTER — Emergency Department (HOSPITAL_COMMUNITY): Payer: Medicare Other

## 2022-08-12 DIAGNOSIS — N201 Calculus of ureter: Secondary | ICD-10-CM | POA: Diagnosis not present

## 2022-08-12 DIAGNOSIS — I700 Atherosclerosis of aorta: Secondary | ICD-10-CM | POA: Diagnosis not present

## 2022-08-12 DIAGNOSIS — K429 Umbilical hernia without obstruction or gangrene: Secondary | ICD-10-CM | POA: Diagnosis not present

## 2022-08-12 DIAGNOSIS — N132 Hydronephrosis with renal and ureteral calculous obstruction: Secondary | ICD-10-CM | POA: Insufficient documentation

## 2022-08-12 DIAGNOSIS — Z7982 Long term (current) use of aspirin: Secondary | ICD-10-CM | POA: Insufficient documentation

## 2022-08-12 DIAGNOSIS — N400 Enlarged prostate without lower urinary tract symptoms: Secondary | ICD-10-CM | POA: Diagnosis not present

## 2022-08-12 DIAGNOSIS — K439 Ventral hernia without obstruction or gangrene: Secondary | ICD-10-CM | POA: Diagnosis not present

## 2022-08-12 DIAGNOSIS — M545 Low back pain, unspecified: Secondary | ICD-10-CM | POA: Diagnosis present

## 2022-08-12 LAB — URINALYSIS, ROUTINE W REFLEX MICROSCOPIC
Bacteria, UA: NONE SEEN
Bilirubin Urine: NEGATIVE
Glucose, UA: NEGATIVE mg/dL
Hgb urine dipstick: NEGATIVE
Ketones, ur: NEGATIVE mg/dL
Nitrite: NEGATIVE
Protein, ur: 30 mg/dL — AB
Specific Gravity, Urine: 1.02 (ref 1.005–1.030)
pH: 5 (ref 5.0–8.0)

## 2022-08-12 LAB — BASIC METABOLIC PANEL
Anion gap: 12 (ref 5–15)
BUN: 35 mg/dL — ABNORMAL HIGH (ref 8–23)
CO2: 25 mmol/L (ref 22–32)
Calcium: 10.1 mg/dL (ref 8.9–10.3)
Chloride: 102 mmol/L (ref 98–111)
Creatinine, Ser: 1.52 mg/dL — ABNORMAL HIGH (ref 0.61–1.24)
GFR, Estimated: 46 mL/min — ABNORMAL LOW (ref >60)
Glucose, Bld: 228 mg/dL — ABNORMAL HIGH (ref 70–99)
Potassium: 3.8 mmol/L (ref 3.5–5.1)
Sodium: 139 mmol/L (ref 135–145)

## 2022-08-12 LAB — CBC WITH DIFFERENTIAL/PLATELET
Abs Immature Granulocytes: 0.03 10*3/uL (ref 0.00–0.07)
Basophils Absolute: 0 10*3/uL (ref 0.0–0.1)
Basophils Relative: 1 %
Eosinophils Absolute: 0.2 10*3/uL (ref 0.0–0.5)
Eosinophils Relative: 2 %
HCT: 39.9 % (ref 39.0–52.0)
Hemoglobin: 13.2 g/dL (ref 13.0–17.0)
Immature Granulocytes: 0 %
Lymphocytes Relative: 17 %
Lymphs Abs: 1.4 10*3/uL (ref 0.7–4.0)
MCH: 29.9 pg (ref 26.0–34.0)
MCHC: 33.1 g/dL (ref 30.0–36.0)
MCV: 90.5 fL (ref 80.0–100.0)
Monocytes Absolute: 0.6 10*3/uL (ref 0.1–1.0)
Monocytes Relative: 8 %
Neutro Abs: 6 10*3/uL (ref 1.7–7.7)
Neutrophils Relative %: 72 %
Platelets: 222 10*3/uL (ref 150–400)
RBC: 4.41 MIL/uL (ref 4.22–5.81)
RDW: 12.6 % (ref 11.5–15.5)
WBC: 8.3 10*3/uL (ref 4.0–10.5)
nRBC: 0 % (ref 0.0–0.2)

## 2022-08-12 MED ORDER — ONDANSETRON HCL 4 MG/2ML IJ SOLN
4.0000 mg | Freq: Once | INTRAMUSCULAR | Status: AC
  Administered 2022-08-12: 4 mg via INTRAVENOUS
  Filled 2022-08-12: qty 2

## 2022-08-12 MED ORDER — TAMSULOSIN HCL 0.4 MG PO CAPS: 0.4000 mg | ORAL_CAPSULE | Freq: Every day | ORAL | 0 refills | Status: DC

## 2022-08-12 MED ORDER — FENTANYL CITRATE PF 50 MCG/ML IJ SOSY
50.0000 ug | PREFILLED_SYRINGE | Freq: Once | INTRAMUSCULAR | Status: AC
  Administered 2022-08-12: 50 ug via INTRAVENOUS
  Filled 2022-08-12: qty 1

## 2022-08-12 MED ORDER — ONDANSETRON HCL 4 MG PO TABS: 4.0000 mg | ORAL_TABLET | Freq: Three times a day (TID) | ORAL | 0 refills | Status: DC | PRN

## 2022-08-12 MED ORDER — OXYCODONE-ACETAMINOPHEN 5-325 MG PO TABS: 1.0000 | ORAL_TABLET | Freq: Four times a day (QID) | ORAL | 0 refills | Status: DC | PRN

## 2022-08-12 MED ORDER — SODIUM CHLORIDE 0.9 % IV BOLUS
500.0000 mL | Freq: Once | INTRAVENOUS | Status: AC
  Administered 2022-08-12: 500 mL via INTRAVENOUS

## 2022-08-12 NOTE — Discharge Instructions (Addendum)
You were seen in the emergency department for right flank pain.  Your CAT scan showed 2 kidney stones in the tube going from the kidney to the bladder.  These are large but they may pass on their own.  Please drink plenty of fluids and you can use Tylenol and ibuprofen for pain.  We are sending a prescription for some stronger pain medicine, nausea medication, and medication that may help the stone pass.  Please reach out to the urologist for close follow-up.  Return to the emergency department if any fever uncontrolled pain or other concerns.

## 2022-08-12 NOTE — ED Triage Notes (Signed)
Pt with right flank pain x 3 days, denies any burning with urination or blood in urine. Pt with hx of kidney stones. +nausea

## 2022-08-12 NOTE — ED Provider Notes (Signed)
[facility] Provider Note   CSN: [id number] Arrival date & time: 08/12/22  1539     History  Chief Complaint  Patient presents with   Flank Pain    Julian Dominguez is a 82 y.o. male.  He is here for evaluation of right lower back pain flank pain that started 3 days ago.  He rates it as 7 out of 10 waxes and wanes associated with nausea.  No trauma.  No abdominal pain.  No urinary symptoms.  No fevers or chills no numbness or weakness.  Has tried nothing for it.  He does endorse having history of kidney stones and says this feels similar.  The history is provided by the patient.  Flank Pain This is a new problem. The current episode started more than 2 days ago. The problem occurs constantly. The problem has not changed since onset.Pertinent negatives include no chest pain, no abdominal pain, no headaches and no shortness of breath. Nothing aggravates the symptoms. Nothing relieves the symptoms. He has tried rest for the symptoms. The treatment provided no relief.       Home Medications Prior to Admission medications   Medication Sig Start Date End Date Taking? Authorizing Provider  Ascorbic Acid (VITAMIN C) 1000 MG tablet Take 1,000 mg by mouth daily.      [provider]  aspirin 81 MG chewable tablet Chew 81 mg by mouth daily.    [provider]  B Complex-C (SUPER B COMPLEX PO) Take 1 tablet by mouth daily.    [provider]  diclofenac Sodium (VOLTAREN) 1 % GEL Apply topically as needed.    [provider]  Garlic 3810 MG CAPS Take 2,000 mg by mouth daily.     [provider]  glimepiride (AMARYL) 2 MG tablet Take 2 mg by mouth daily with breakfast.    [provider]  glucosamine-chondroitin 500-400 MG tablet Take 2 tablets by mouth daily.    [provider]  ibuprofen (ADVIL) 200 MG tablet Take 400 mg by mouth every 6 (six) hours as needed for moderate pain.     [provider]  metFORMIN HCl ER 500 MG/5ML SRER Take 500 mg by mouth 2 (two) times daily.    [provider]  Multiple Vitamin (MULTIVITAMIN) tablet Take 1 tablet by mouth daily.      [provider]  naproxen sodium (ALEVE) 220 MG tablet Take 220 mg by mouth.    [provider]  Omega-3 Fatty Acids (FISH OIL) 1000 MG CPDR Take 1,200 mg by mouth daily after breakfast. 2 pills in am    [provider]  oxybutynin (DITROPAN-XL) 5 MG 24 hr tablet Take 1 tablet (5 mg total) by mouth at bedtime. 07/05/22   Marcial Pacas, MD  rosuvastatin (CRESTOR) 5 MG tablet Take 5 mg by mouth once a week. monday    [provider]  valsartan (DIOVAN) 40 MG tablet Take 40 mg by mouth daily.    [provider]  vitamin E 400 UNIT capsule Take 400 Units by mouth daily.    [provider]      Allergies    Morphine and related    Review of Systems   Review of Systems  Constitutional:  Negative for fever.  HENT:  Negative for sore throat.   Respiratory:  Negative for shortness of breath.   Cardiovascular:  Negative for chest pain.  Gastrointestinal:  Positive for nausea. Negative  for abdominal pain and vomiting.  Genitourinary:  Positive for flank pain. Negative for dysuria and hematuria.  Musculoskeletal:  Positive for back pain.  Skin:  Negative for rash.  Neurological:  Negative for weakness, numbness and headaches.    Physical Exam Updated Vital Signs BP (!) 158/69 (BP Location: Right Arm)   Pulse (!) 59   Temp (!) 97.5 F (36.4 C) (Oral)   Resp 14   Ht '5\' 8"'$  (1.727 m)   Wt 95.3 kg   SpO2 95%   BMI 31.93 kg/m  Physical Exam Vitals and nursing note reviewed.  Constitutional:      General: He is not in acute distress.    Appearance: Normal appearance. He is well-developed.  HENT:     Head: Normocephalic and atraumatic.  Eyes:     Conjunctiva/sclera: Conjunctivae normal.  Cardiovascular:     Rate and Rhythm: Normal rate  and regular rhythm.     Heart sounds: No murmur heard. Pulmonary:     Effort: Pulmonary effort is normal. No respiratory distress.     Breath sounds: Normal breath sounds.  Abdominal:     Palpations: Abdomen is soft.     Tenderness: There is no abdominal tenderness. There is no guarding or rebound.     Hernia: A hernia (ventral diastasis) is present.  Musculoskeletal:        General: No deformity.     Cervical back: Neck supple.  Skin:    General: Skin is warm and dry.     Capillary Refill: Capillary refill takes less than 2 seconds.  Neurological:     Mental Status: He is alert.     Sensory: No sensory deficit.     Motor: No weakness.     Gait: Gait normal.     ED Results / Procedures / Treatments   Labs (all labs ordered are listed, but only abnormal results are displayed) Labs Reviewed  URINALYSIS, ROUTINE W REFLEX MICROSCOPIC - Abnormal; Notable for the following components:      Result Value   Protein, ur 30 (*)    Leukocytes,Ua SMALL (*)    All other components within normal limits  BASIC METABOLIC PANEL - Abnormal; Notable for the following components:   Glucose, Bld 228 (*)    BUN 35 (*)    Creatinine, Ser 1.52 (*)    GFR, Estimated 46 (*)    All other components within normal limits  CBC WITH DIFFERENTIAL/PLATELET    EKG None  Radiology CT Renal Stone Study  Result Date: 08/12/2022 CLINICAL DATA:  Acute right flank pain. EXAM: CT ABDOMEN AND PELVIS WITHOUT CONTRAST TECHNIQUE: Multidetector CT imaging of the abdomen and pelvis was performed following the standard protocol without IV contrast. RADIATION DOSE REDUCTION: This exam was performed according to the departmental dose-optimization program which includes automated exposure control, adjustment of the mA and/or kV according to patient size and/or use of iterative reconstruction technique. COMPARISON:  February 24, 2021. FINDINGS: Lower chest: No acute abnormality. Hepatobiliary: No focal liver abnormality is  seen. Status post cholecystectomy. No biliary dilatation. Pancreas: Unremarkable. No pancreatic ductal dilatation or surrounding inflammatory changes. Spleen: Normal in size without focal abnormality. Adrenals/Urinary Tract: Adrenal glands appear normal. Bilateral nephrolithiasis is noted. Moderate right hydroureteronephrosis is noted secondary to 2 distal right ureteral calculi, the largest measuring 6 mm at the right ureterovesical junction. Urinary bladder is otherwise unremarkable. Stomach/Bowel: Stomach is within normal limits. Appendix appears normal. No evidence of bowel wall thickening, distention, or inflammatory changes. Vascular/Lymphatic:  Aortic atherosclerosis. No enlarged abdominal or pelvic lymph nodes. Reproductive: Mild prostatic enlargement is noted. Other: Small fat containing periumbilical hernia. No ascites is noted. Musculoskeletal: Status post surgical posterior fusion of lumbar spine. No acute abnormality is noted. IMPRESSION: Bilateral nephrolithiasis. Moderate right hydroureteronephrosis is noted secondary to 2 distal right ureteral calculi, the largest measuring 6 mm at the right uterovesical junction. Mild prostatic enlargement. Aortic Atherosclerosis (ICD10-I70.0). Electronically Signed   By: Marijo Conception M.D.   On: 08/12/2022 16:54    Procedures Procedures    Medications Ordered in ED Medications  sodium chloride 0.9 % bolus 500 mL (has no administration in time range)  ondansetron (ZOFRAN) injection 4 mg (has no administration in time range)  fentaNYL (SUBLIMAZE) injection 50 mcg (has no administration in time range)    ED Course/ Medical Decision Making/ A&P Clinical Course as of 08/13/22 1012  Thu Aug 12, 2022  1659 Patient states his pain is much better after medication. [MB]    Clinical Course User Index [MB] Hayden Rasmussen, MD                             Medical Decision Making Amount and/or Complexity of Data Reviewed Labs: ordered. Radiology:  ordered.  Risk Prescription drug management.   This patient complains of right flank and lower back pain; this involves an extensive number of treatment Options and is a complaint that carries with it a high risk of complications and morbidity. The differential includes renal colic, musculoskeletal, aneurysm, retroperitoneal bleed, radiculopathy  I ordered, reviewed and interpreted labs, which included CBC normal, chemistries with elevated glucose elevated BUN and creatinine, urinalysis without clear signs of infection I ordered medication IV fluids and nausea medication IV pain medication and reviewed PMP when indicated. I ordered imaging studies which included CT renal and I independently    visualized and interpreted imaging which showed multiple distal ureteral stones Additional history obtained from patient's wife Previous records obtained and reviewed in epic no recent admissions Cardiac monitoring reviewed, normal sinus rhythm Social determinants considered, no significant barriers Critical Interventions: None  After the interventions stated above, I reevaluated the patient and found patient's pain to be improved, tolerating p.o. Admission and further testing considered, no indications for admission or further workup at this time.  Will cover with pain medication nausea medication and Flomax.  Recommended close follow-up with urology.  Return instructions discussed.         Final Clinical Impression(s) / ED Diagnoses Final diagnoses:  Ureterolithiasis    Rx / DC Orders ED Discharge Orders          Ordered    oxyCODONE-acetaminophen (PERCOCET/ROXICET) 5-325 MG tablet  Every 6 hours PRN        08/12/22 1814    tamsulosin (FLOMAX) 0.4 MG CAPS capsule  Daily        08/12/22 1814    ondansetron (ZOFRAN) 4 MG tablet  Every 8 hours PRN        08/12/22 1814              Hayden Rasmussen, MD 08/13/22 1014

## 2022-08-19 ENCOUNTER — Ambulatory Visit
Admission: RE | Admit: 2022-08-19 | Discharge: 2022-08-19 | Disposition: A | Discharge status: Home / Self Care | Payer: Medicare Other | Source: Ambulatory Visit | Attending: Neurology | Admitting: Neurology

## 2022-08-19 ENCOUNTER — Ambulatory Visit (INDEPENDENT_AMBULATORY_CARE_PROVIDER_SITE_OTHER): Payer: Medicare Other | Admitting: Neurology

## 2022-08-19 ENCOUNTER — Encounter: Payer: Self-pay | Admitting: Neurology

## 2022-08-19 VITALS — BP 196/88 | HR 64

## 2022-08-19 VITALS — BP 174/92 | HR 64 | Ht 68.0 in | Wt 217.0 lb

## 2022-08-19 DIAGNOSIS — R269 Unspecified abnormalities of gait and mobility: Secondary | ICD-10-CM

## 2022-08-19 DIAGNOSIS — R2689 Other abnormalities of gait and mobility: Secondary | ICD-10-CM | POA: Diagnosis not present

## 2022-08-19 DIAGNOSIS — R32 Unspecified urinary incontinence: Secondary | ICD-10-CM | POA: Diagnosis not present

## 2022-08-19 DIAGNOSIS — R4189 Other symptoms and signs involving cognitive functions and awareness: Secondary | ICD-10-CM

## 2022-08-19 DIAGNOSIS — G3184 Mild cognitive impairment, so stated: Secondary | ICD-10-CM | POA: Diagnosis not present

## 2022-08-19 MED ORDER — BUTALBITAL-APAP-CAFFEINE 50-325-40 MG PO TABS: 1.0000 | ORAL_TABLET | Freq: Three times a day (TID) | ORAL | 0 refills | Status: DC | PRN

## 2022-08-19 MED ORDER — OXYBUTYNIN CHLORIDE ER 10 MG PO TB24: 10.0000 mg | ORAL_TABLET | Freq: Every day | ORAL | 11 refills | Status: DC

## 2022-08-19 NOTE — Discharge Instructions (Signed)

## 2022-08-19 NOTE — Progress Notes (Signed)
Chief Complaint  Patient presents with   Follow-up    RM 14 here for f/u for pre and post LP evaluation. He is scheduled for LP today at 74 45 am.      ASSESSMENT AND PLAN  Julian Dominguez is a 82 y.o. male  Gait abnormality Worsening urinary urgency, incontinence,  MRI of the brain did show enlarged ventricle,  Before and post LP examination showed no significant change in his gait, cognitive function,  Advised patient and his family continue observe his symptoms, wife will videotape his gait in the following days,  Less likely a suitable candidate for VP shunt,  Is progressive difficulty likely due to central nervous system degeneration disorder, dementia, aging, deconditioning, low back pain, peripheral neuropathy also contributed to   Mild headache following lumbar puncture,  Advised him lying flat for the rest of the day, increase water intake caffeine-containing beverage  Fioricet as needed   Return To Clinic With NP In 3-4 Months, if there is no significant change, may discharge him to primary care physician  DIAGNOSTIC DATA (LABS, IMAGING, TESTING) - I reviewed patient records, labs, notes, testing and imaging myself where available.   MEDICAL HISTORY:  Julian Dominguez is a 82 year old male, seen in request by Dr. Kristeen Miss, for evaluation of gait abnormality, excessive fatigue, initial evaluation was on October 01, 2021, his primary care physician is Dr. Woody Seller, [facility]    I reviewed and summarized the referring note. PMhx. DM x 20 years HLD HTN Kidney stone Lumbar decompression, did help his low back pain, Right ankle fracture surgery Cervical decompression, right cervical radiculopathy Left shoulder replacement  [place], used to be very active, raise cows, he had a gradual onset gait abnormality over past couple years, especially since September 2022, he contributed to low back pain, was noted to difficult to take a big stride, his  gait is become unsteady  He is more sedentary in the winter, complains of excessive daytime sleepiness, and take frequent Naps, he denies bilateral hands tremor, no loss of sense of smell, no REM sleep disorder,  I personally reviewed MRI of lumbar spine with without contrast September 21, 2021, progressive adjacent mild to lumbar degenerative changes, moderate to severe right foraminal stenosis at L1-2,  L2-5 PLIF  UPDATE Jul 05 2022: He is accompanied by his wife, reports slow progression, worsening gait abnormality, has less control of his legs, fell few times, fairly symmetric bilateral leg difficulty, denies upper extremity involvement  Wife also reported that he has excessive daytime sleepiness, difficulty relaxing and sleep at nighttime,  He also had increased bladder accident,   UPDATE Aug 19 2022: He is on schedule for fluoroscopy guided large-volume lumbar puncture, for evaluation of possible normal pressure hydrocephalus, with his continued progressive memory loss, gait abnormality, urinary incontinence He is accompanied by his wife and daughter at today's clinical visit, I saw him immediately before and after lumbar puncture, there was no significant change in his gait, that was recorded in his wife's cell phone, MoCA examination was 16/30 at previous examination, immediately post LP was 14/30,    PHYSICAL EXAM:   Vitals:   08/19/22 0945  BP: (!) 174/92  Pulse: 64  Weight: 217 lb (98.4 kg)  Height: '5\' 8"'$  (1.727 m)      Body mass index is 32.99 kg/m.  PHYSICAL EXAMNIATION:  Gen: NAD, conversant, well nourised, well groomed  Cardiovascular: Regular rate rhythm, no peripheral edema, warm, nontender. Eyes: Conjunctivae clear without exudates or hemorrhage Neck: Supple, no carotid bruits. Pulmonary: Clear to auscultation bilaterally   NEUROLOGICAL EXAM:  MENTAL STATUS: Speech/Cognition:     08/19/2022    1:00 PM 07/05/2022   10:37 AM 10/01/2021    10:27 AM  Montreal Cognitive Assessment   Visuospatial/ Executive (0/5) 0 1 1  Naming (0/3) '3 3 3  '$ Attention: Read list of digits (0/2) 2 0 1  Attention: Read list of letters (0/1) '1 1 1  '$ Attention: Serial 7 subtraction starting at 100 (0/3) 1 0 1  Language: Repeat phrase (0/2) '1 2 2  '$ Language : Fluency (0/1) 0 0 0  Abstraction (0/2) 2 2 0  Delayed Recall (0/5) 0 2 1  Orientation (0/6) '4 5 6  '$ Total '14 16 16  '$ Adjusted Score (based on education)  16 16    CRANIAL NERVES: CN II: Visual fields are full to confrontation. Pupils are round equal and briskly reactive to light. CN III, IV, VI: extraocular movement are normal. No ptosis. CN V: Facial sensation is intact to light touch CN VII: Face is symmetric with normal eye closure  CN VIII: Hearing is normal to causal conversation. CN IX, X: Phonation is normal. CN XI: Head turning and shoulder shrug are intact CN XII: Narrow oropharyngeal space  MOTOR: Normal strength, no significant resting tremor, no bradykinesia or rigidity,  REFLEXES: Reflexes are 2+ and symmetric at the biceps, triceps, knees, and ankles. Plantar responses are flexor.  SENSORY: Mild length dependent sensory changes  COORDINATION: There is no trunk or limb dysmetria noted.  GAIT/STANCE: He needs push-up to get up from seated position, cautious, mildly unsteady, wide-based, limited by his low back pain, joint pain, and body habitus  REVIEW OF SYSTEMS:  Full 14 system review of systems performed and notable only for as above All other review of systems were negative.   ALLERGIES: Allergies  Allergen Reactions   Morphine And Related Nausea And Vomiting    HOME MEDICATIONS: Current Outpatient Medications  Medication Sig Dispense Refill   Ascorbic Acid (VITAMIN C) 1000 MG tablet Take 1,000 mg by mouth daily.       aspirin 81 MG chewable tablet Chew 81 mg by mouth daily.     diclofenac Sodium (VOLTAREN) 1 % GEL Apply topically as needed.     ferrous  sulfate 325 (65 FE) MG EC tablet Take 325 mg by mouth daily with breakfast.     Garlic 1062 MG CAPS Take 2,000 mg by mouth daily.      glimepiride (AMARYL) 2 MG tablet Take 2 mg by mouth 2 (two) times daily.     glucosamine-chondroitin 500-400 MG tablet Take 2 tablets by mouth daily.     ibuprofen (ADVIL) 200 MG tablet Take 400 mg by mouth every 6 (six) hours as needed for moderate pain.     metFORMIN HCl ER 500 MG/5ML SRER Take 500 mg by mouth 2 (two) times daily.     Multiple Vitamin (MULTIVITAMIN) tablet Take 1 tablet by mouth daily.       naproxen sodium (ALEVE) 220 MG tablet Take 220 mg by mouth.     Omega-3 Fatty Acids (FISH OIL) 1000 MG CPDR Take 1,200 mg by mouth daily after breakfast. 2 pills in am     oxyCODONE-acetaminophen (PERCOCET/ROXICET) 5-325 MG tablet Take 1 tablet by mouth every 6 (six) hours as needed for severe pain. 10 tablet 0   rosuvastatin (CRESTOR)  5 MG tablet Take 5 mg by mouth once a week. monday     valsartan (DIOVAN) 40 MG tablet Take 40 mg by mouth daily.     vitamin E 400 UNIT capsule Take 400 Units by mouth daily.     No current facility-administered medications for this visit.    PAST MEDICAL HISTORY: Past Medical History:  Diagnosis Date   Arthritis    hands and back   Cataract    Diverticulosis    at 04-28-11 colon    GERD (gastroesophageal reflux disease)    History of heart murmur in childhood    History of kidney stones    Hyperlipidemia    takes red yeast rice    Hypertension    Tubular adenoma of colon 04/28/2011   Type 2 dm    Wears hearing aid in both ears 02/25/2021    PAST SURGICAL HISTORY: Past Surgical History:  Procedure Laterality Date   ANKLE FRACTURE SURGERY Right 1996   hardware   ANTERIOR LAT LUMBAR FUSION Left 02/13/2019   Procedure: Left side Lumbar two-three Lumbar three-four Lumbar four-five Anterolateral decompression, posterior pedicle screw fixation;  Surgeon: Kristeen Miss, MD;  Location: [facility];  Service:  Neurosurgery;  Laterality: Left;   APPLICATION OF ROBOTIC ASSISTANCE FOR SPINAL PROCEDURE N/A 02/13/2019   Procedure: APPLICATION OF ROBOTIC ASSISTANCE FOR SPINAL PROCEDURE;  Surgeon: Kristeen Miss, MD;  Location: [facility];  Service: Neurosurgery;  Laterality: N/A;   CATARACT EXTRACTION, BILATERAL  2011   CHOLECYSTECTOMY  1997   COLONOSCOPY  2017   cyst removed from finger     yrs ago   EYE SURGERY     bilateral cataract surgery with lens implants    LUMBAR PERCUTANEOUS PEDICLE SCREW 3 LEVEL N/A 02/13/2019   Procedure: LUMBAR PERCUTANEOUS PEDICLE SCREW LUMBAR TWO TO LUMBAR FIVE;  Surgeon: Kristeen Miss, MD;  Location: [facility];  Service: Neurosurgery;  Laterality: N/A;   NECK SURGERY  2018   POLYPECTOMY     REVERSE SHOULDER ARTHROPLASTY Left 06/26/2019   Procedure: REVERSE SHOULDER ARTHROPLASTY;  Surgeon: Justice Britain, MD;  Location: [facility];  Service: Orthopedics;  Laterality: Left;  152mn    FAMILY HISTORY: Family History  Problem Relation Age of Onset   Colon cancer Father 641  Stomach cancer Maternal Grandfather 565  Stomach cancer Maternal Aunt    Colon cancer Maternal Uncle    Colon cancer Cousin    Colon cancer Cousin    Colon polyps Neg Hx    Rectal cancer Neg Hx    Esophageal cancer Neg Hx     SOCIAL HISTORY: Social History   Socioeconomic History   Marital status: Married    Spouse name: Not on file   Number of children: 2   Years of education: Not on file   Highest education level: Not on file  Occupational History   Occupation: Retired  Tobacco Use   Smoking status: Former    Types: Cigarettes    Quit date: 04/13/1969    Years since quitting: 53.3   Smokeless tobacco: Current    Types: Chew  Vaping Use   Vaping Use: Never used  Substance and Sexual Activity   Alcohol use: No   Drug use: No   Sexual activity: Not on file  Other Topics Concern   Not on file  Social History Narrative   Not on file   Social Determinants of Health   Financial Resource  Strain: Not on file  Food Insecurity: Not  on file  Transportation Needs: Not on file  Physical Activity: Not on file  Stress: Not on file  Social Connections: Not on file  Intimate Partner Violence: Not on file      Marcial Pacas, M.D. Ph.D.  [facility] [address], [place], [place] [address] Ph: [phone] Fax: [phone]  CC:  Glenda Chroman, MD [place],  [place] [address]  Glenda Chroman, MD

## 2022-09-02 DIAGNOSIS — Z299 Encounter for prophylactic measures, unspecified: Secondary | ICD-10-CM | POA: Diagnosis not present

## 2022-09-02 DIAGNOSIS — I10 Essential (primary) hypertension: Secondary | ICD-10-CM | POA: Diagnosis not present

## 2022-09-02 DIAGNOSIS — H6120 Impacted cerumen, unspecified ear: Secondary | ICD-10-CM | POA: Diagnosis not present

## 2022-09-08 DIAGNOSIS — I10 Essential (primary) hypertension: Secondary | ICD-10-CM | POA: Diagnosis not present

## 2022-09-08 DIAGNOSIS — H6122 Impacted cerumen, left ear: Secondary | ICD-10-CM | POA: Diagnosis not present

## 2022-09-08 DIAGNOSIS — Z299 Encounter for prophylactic measures, unspecified: Secondary | ICD-10-CM | POA: Diagnosis not present

## 2022-09-09 DIAGNOSIS — L538 Other specified erythematous conditions: Secondary | ICD-10-CM | POA: Diagnosis not present

## 2022-09-09 DIAGNOSIS — H53452 Other localized visual field defect, left eye: Secondary | ICD-10-CM | POA: Diagnosis not present

## 2022-09-09 DIAGNOSIS — Z789 Other specified health status: Secondary | ICD-10-CM | POA: Diagnosis not present

## 2022-09-09 DIAGNOSIS — B078 Other viral warts: Secondary | ICD-10-CM | POA: Diagnosis not present

## 2022-09-09 DIAGNOSIS — D170 Benign lipomatous neoplasm of skin and subcutaneous tissue of head, face and neck: Secondary | ICD-10-CM | POA: Diagnosis not present

## 2022-09-09 DIAGNOSIS — L298 Other pruritus: Secondary | ICD-10-CM | POA: Diagnosis not present

## 2022-09-09 DIAGNOSIS — R238 Other skin changes: Secondary | ICD-10-CM | POA: Diagnosis not present

## 2022-09-20 LAB — VDRL, CSF: VDRL Quant, CSF: NONREACTIVE

## 2022-09-20 LAB — FUNGUS CULTURE W SMEAR
CULTURE:: NO GROWTH
MICRO NUMBER:: 14505339
SMEAR:: NONE SEEN
SPECIMEN QUALITY:: ADEQUATE

## 2022-09-20 LAB — CSF CELL COUNT WITH DIFFERENTIAL
RBC Count, CSF: 0 cells/uL
TOTAL NUCLEATED CELL: 0 cells/uL (ref 0–5)

## 2022-09-20 LAB — GRAM STAIN
MICRO NUMBER:: 14505338
SPECIMEN QUALITY:: ADEQUATE

## 2022-09-20 LAB — PROTEIN, CSF: Total Protein, CSF: 60 mg/dL (ref 15–60)

## 2022-09-20 LAB — GLUCOSE, CSF: Glucose, CSF: 86 mg/dL — ABNORMAL HIGH (ref 40–80)

## 2022-12-10 DIAGNOSIS — Z299 Encounter for prophylactic measures, unspecified: Secondary | ICD-10-CM | POA: Diagnosis not present

## 2022-12-10 DIAGNOSIS — G919 Hydrocephalus, unspecified: Secondary | ICD-10-CM | POA: Diagnosis not present

## 2022-12-10 DIAGNOSIS — E1165 Type 2 diabetes mellitus with hyperglycemia: Secondary | ICD-10-CM | POA: Diagnosis not present

## 2022-12-10 DIAGNOSIS — I10 Essential (primary) hypertension: Secondary | ICD-10-CM | POA: Diagnosis not present

## 2022-12-10 DIAGNOSIS — F17210 Nicotine dependence, cigarettes, uncomplicated: Secondary | ICD-10-CM | POA: Diagnosis not present

## 2022-12-10 DIAGNOSIS — R270 Ataxia, unspecified: Secondary | ICD-10-CM | POA: Diagnosis not present

## 2022-12-15 ENCOUNTER — Encounter: Payer: Self-pay | Admitting: Adult Health

## 2022-12-15 ENCOUNTER — Telehealth: Payer: Self-pay | Admitting: Adult Health

## 2022-12-15 ENCOUNTER — Ambulatory Visit (INDEPENDENT_AMBULATORY_CARE_PROVIDER_SITE_OTHER): Payer: Medicare Other | Admitting: Adult Health

## 2022-12-15 VITALS — BP 160/77 | HR 58 | Ht 68.0 in | Wt 219.8 lb

## 2022-12-15 DIAGNOSIS — R32 Unspecified urinary incontinence: Secondary | ICD-10-CM | POA: Diagnosis not present

## 2022-12-15 DIAGNOSIS — R269 Unspecified abnormalities of gait and mobility: Secondary | ICD-10-CM

## 2022-12-15 DIAGNOSIS — R4781 Slurred speech: Secondary | ICD-10-CM | POA: Diagnosis not present

## 2022-12-15 NOTE — Progress Notes (Signed)
PATIENT: Julian Dominguez DOB: November 02, 1940  REASON FOR VISIT: follow up HISTORY FROM: patient PRIMARY NEUROLOGIST: Dr. Lucia Gaskins  Chief Complaint  Patient presents with   Follow-up    Pt in 4 with wife  Wife states 12/06/2022 pt woke up with slurred speech  and gait was off no ED visit and than 12/10/2022 Pt fell 4 times Wife states went to PCP . PCP states make aware at appointment today  Wife states pt is currently on ditropan current dosage is not helping      HISTORY OF PRESENT ILLNESS:   HISTORY Julian Dominguez is a 82 year old male, seen in request by Dr. Barnett Abu, for evaluation of gait abnormality, excessive fatigue, initial evaluation was on October 01, 2021, his primary care physician is Dr. Sherril Croon, [facility]     I reviewed and summarized the referring note. PMhx. DM x 20 years HLD HTN Kidney stone Lumbar decompression, did help his low back pain, Right ankle fracture surgery Cervical decompression, right cervical radiculopathy Left shoulder replacement   [place], used to be very active, raise cows, he had a gradual onset gait abnormality over past couple years, especially since September 2022, he contributed to low back pain, was noted to difficult to take a big stride, his gait is become unsteady  He is more sedentary in the winter, complains of excessive daytime sleepiness, and take frequent Naps, he denies bilateral hands tremor, no loss of sense of smell, no REM sleep disorder,   I personally reviewed MRI of lumbar spine with without contrast September 21, 2021, progressive adjacent mild to lumbar degenerative changes, moderate to severe right foraminal stenosis at L1-2,  L2-5 PLIF   UPDATE Jul 05 2022: He is accompanied by his wife, reports slow progression, worsening gait abnormality, has less control of his legs, fell few times, fairly symmetric bilateral leg difficulty, denies upper extremity involvement   Wife also reported that he has  excessive daytime sleepiness, difficulty relaxing and sleep at nighttime,   He also had increased bladder accident,    UPDATE Aug 19 2022: He is on schedule for fluoroscopy guided large-volume lumbar puncture, for evaluation of possible normal pressure hydrocephalus, with his continued progressive memory loss, gait abnormality, urinary incontinence He is accompanied by his wife and daughter at today's clinical visit, I saw him immediately before and after lumbar puncture, there was no significant change in his gait, that was recorded in his wife's cell phone, MoCA examination was 16/30 at previous examination, immediately post LP was 14/30,  UPDATE 12/15/22  Julian Dominguez is a 82 y.o. male who has been followed in this office for gait abnormality, urinary incontinence and memory disturbance. Returns today for follow-up.  At the last visit he had a large-volume lumbar puncture but evaluation before and after did not show improvement in his gait or memory testing.  Wife states that on May 20 the patient woke up with slurred speech.  They did not take him to the emergency room.  She did notice some generalized weakness.  She states that the slurred speech has gotten better but has not fully resolved.  Patient continues to have trouble with his gait.  She states that several days afterwards he did have 1 day that he had 4 falls.  Fortunately no significant injuries.  Uses a cane when ambulating.  Continues to have trouble with his gait.  Wife reports he still has issues with urinary incontinence despite taking Ditropan.  Reports that he often will drag his right toe.  He returns today for an evaluation.    REVIEW OF SYSTEMS: Out of a complete 14 system review of symptoms, the patient complains only of the following symptoms, and all other reviewed systems are negative.  ALLERGIES: Allergies  Allergen Reactions   Morphine And Codeine Nausea And Vomiting    HOME MEDICATIONS: Outpatient  Medications Prior to Visit  Medication Sig Dispense Refill   Ascorbic Acid (VITAMIN C) 1000 MG tablet Take 1,000 mg by mouth daily.       aspirin 81 MG chewable tablet Chew 81 mg by mouth daily.     butalbital-acetaminophen-caffeine (FIORICET) 50-325-40 MG tablet Take 1 tablet by mouth every 8 (eight) hours as needed for headache. 20 tablet 0   diclofenac Sodium (VOLTAREN) 1 % GEL Apply topically as needed.     ferrous sulfate 325 (65 FE) MG EC tablet Take 325 mg by mouth daily with breakfast.     Garlic 1000 MG CAPS Take 2,000 mg by mouth daily.      glimepiride (AMARYL) 2 MG tablet Take 2 mg by mouth 2 (two) times daily.     glucosamine-chondroitin 500-400 MG tablet Take 2 tablets by mouth daily.     ibuprofen (ADVIL) 200 MG tablet Take 400 mg by mouth every 6 (six) hours as needed for moderate pain.     metFORMIN HCl ER 500 MG/5ML SRER Take 500 mg by mouth 2 (two) times daily.     Multiple Vitamin (MULTIVITAMIN) tablet Take 1 tablet by mouth daily.       naproxen sodium (ALEVE) 220 MG tablet Take 220 mg by mouth.     Omega-3 Fatty Acids (FISH OIL) 1000 MG CPDR Take 1,200 mg by mouth daily after breakfast. 2 pills in am     oxybutynin (DITROPAN XL) 10 MG 24 hr tablet Take 1 tablet (10 mg total) by mouth at bedtime. 30 tablet 11   oxyCODONE-acetaminophen (PERCOCET/ROXICET) 5-325 MG tablet Take 1 tablet by mouth every 6 (six) hours as needed for severe pain. 10 tablet 0   rosuvastatin (CRESTOR) 5 MG tablet Take 5 mg by mouth once a week. monday     valsartan (DIOVAN) 40 MG tablet Take 40 mg by mouth daily.     vitamin E 400 UNIT capsule Take 400 Units by mouth daily.     No facility-administered medications prior to visit.    PAST MEDICAL HISTORY: Past Medical History:  Diagnosis Date   Arthritis    hands and back   Cataract    Diverticulosis    at 04-28-11 colon    GERD (gastroesophageal reflux disease)    History of heart murmur in childhood    History of kidney stones     Hyperlipidemia    takes red yeast rice    Hypertension    Tubular adenoma of colon 04/28/2011   Type 2 dm    Wears hearing aid in both ears 02/25/2021    PAST SURGICAL HISTORY: Past Surgical History:  Procedure Laterality Date   ANKLE FRACTURE SURGERY Right 1996   hardware   ANTERIOR LAT LUMBAR FUSION Left 02/13/2019   Procedure: Left side Lumbar two-three Lumbar three-four Lumbar four-five Anterolateral decompression, posterior pedicle screw fixation;  Surgeon: Barnett Abu, MD;  Location: [facility] OR;  Service: Neurosurgery;  Laterality: Left;   APPLICATION OF ROBOTIC ASSISTANCE FOR SPINAL PROCEDURE N/A 02/13/2019   Procedure: APPLICATION OF ROBOTIC ASSISTANCE FOR SPINAL PROCEDURE;  Surgeon: Barnett Abu, MD;  Location: [facility] OR;  Service: Neurosurgery;  Laterality: N/A;   CATARACT EXTRACTION, BILATERAL  2011   CHOLECYSTECTOMY  1997   COLONOSCOPY  2017   cyst removed from finger     yrs ago   EYE SURGERY     bilateral cataract surgery with lens implants    LUMBAR PERCUTANEOUS PEDICLE SCREW 3 LEVEL N/A 02/13/2019   Procedure: LUMBAR PERCUTANEOUS PEDICLE SCREW LUMBAR TWO TO LUMBAR FIVE;  Surgeon: Barnett Abu, MD;  Location: [facility] OR;  Service: Neurosurgery;  Laterality: N/A;   NECK SURGERY  2018   POLYPECTOMY     REVERSE SHOULDER ARTHROPLASTY Left 06/26/2019   Procedure: REVERSE SHOULDER ARTHROPLASTY;  Surgeon: Francena Hanly, MD;  Location: [facility];  Service: Orthopedics;  Laterality: Left;     FAMILY HISTORY: Family History  Problem Relation Age of Onset   Colon cancer Father 68   Stomach cancer Maternal Grandfather 35   Stomach cancer Maternal Aunt    Colon cancer Maternal Uncle    Colon cancer Cousin    Colon cancer Cousin    Colon polyps Neg Hx    Rectal cancer Neg Hx    Esophageal cancer Neg Hx     SOCIAL HISTORY: Social History   Socioeconomic History   Marital status: Married    Spouse name: Not on file   Number of children: 2   Years of education: Not on  file   Highest education level: Not on file  Occupational History   Occupation: Retired  Tobacco Use   Smoking status: Former    Types: Cigarettes    Quit date: 04/13/1969    Years since quitting: 53.7   Smokeless tobacco: Current    Types: Chew  Vaping Use   Vaping Use: Never used  Substance and Sexual Activity   Alcohol use: No   Drug use: No   Sexual activity: Not on file  Other Topics Concern   Not on file  Social History Narrative   Not on file   Social Determinants of Health   Financial Resource Strain: Not on file  Food Insecurity: Not on file  Transportation Needs: Not on file  Physical Activity: Not on file  Stress: Not on file  Social Connections: Not on file  Intimate Partner Violence: Not on file      PHYSICAL EXAM  Vitals:   12/15/22 1142  BP: (!) 160/77  Pulse: (!) 58  Weight: 219 lb 12.8 oz (99.7 kg)  Height: 5\' 8"  (1.727 m)   Body mass index is 33.42 kg/m.  Generalized: Well developed, in no acute distress   Neurological examination  Mentation: Alert oriented to time, place, history taking. Follows all commands speech with mild dysarthric. Cranial nerve II-XII: Pupils were equal round reactive to light. Extraocular movements were full, visual field were full on confrontational test. Facial sensation and strength were normal. Uvula tongue midline. Head turning and shoulder shrug  were normal and symmetric. Motor: The motor testing reveals 5 over 5 strength in all extremities with exception of 4 out of 5 strength in the left lower extremity with mild weakness with dorsiflexion as well.  Good symmetric motor tone is noted throughout.  Sensory: Sensory testing is intact to soft touch on all 4 extremities. No evidence of extinction is noted.  Coordination: Cerebellar testing reveals good finger-nose-finger and heel-to-shin bilaterally.  Gait and station: Patient requires assistance with standing.  Patient has decreased stride often dragging the right  toe.  Tandem gait not attempted Reflexes: Deep  tendon reflexes are decreased in the left lower extremity  DIAGNOSTIC DATA (LABS, IMAGING, TESTING) - I reviewed patient records, labs, notes, testing and imaging myself where available.  Lab Results  Component Value Date   WBC 8.3 08/12/2022   HGB 13.2 08/12/2022   HCT 39.9 08/12/2022   MCV 90.5 08/12/2022   PLT 222 08/12/2022      Component Value Date/Time   NA 139 08/12/2022 1611   K 3.8 08/12/2022 1611   CL 102 08/12/2022 1611   CO2 25 08/12/2022 1611   GLUCOSE 228 (H) 08/12/2022 1611   BUN 35 (H) 08/12/2022 1611   CREATININE 1.52 (H) 08/12/2022 1611   CALCIUM 10.1 08/12/2022 1611   GFRNONAA 46 (L) 08/12/2022 1611   GFRAA >60 06/22/2019 1058   No results found for: "CHOL", "HDL", "LDLCALC", "LDLDIRECT", "TRIG", "CHOLHDL" Lab Results  Component Value Date   HGBA1C 6.5 (H) 06/22/2019   Lab Results  Component Value Date   VITAMINB12 405 03/03/2022   No results found for: "TSH"    ASSESSMENT AND PLAN 82 y.o. year old male  has a past medical history of Arthritis, Cataract, Diverticulosis, GERD (gastroesophageal reflux disease), History of heart murmur in childhood, History of kidney stones, Hyperlipidemia, Hypertension, Tubular adenoma of colon (04/28/2011), Type 2 dm, and Wears hearing aid in both ears (02/25/2021). here with:  1.  Gait difficulty 2.  Slurred speech 3.  Urinary incontinence  -Will order MRI of the brain to rule out potential infarct due to new symptoms of slurred speech -Discussed potentially doing an MRI of the lumbar spine due to gait abnormality however we will wait till after results from the MRI of the brain -Advised if the patient develops strokelike symptoms he should go to the emergency room.  Also advised that if he has worsening or new symptoms he should let us know or if symptoms are concerning he should also go to the emergency room. -Follow-up in 1 month with Dr. Terrace Arabia  or sooner if  needed   Butch Penny, MSN, NP-C 12/15/2022, 11:13 AM [facility] [address] [place], [place] [address] [phone]

## 2022-12-15 NOTE — Telephone Encounter (Signed)
Referral faxed to [facility]: Phone: [phone]   Fax: [phone]

## 2022-12-15 NOTE — Telephone Encounter (Signed)
medicare/BC[employer] sup NPR sent to GI [phone] 

## 2022-12-16 ENCOUNTER — Telehealth: Payer: Self-pay | Admitting: *Deleted

## 2022-12-16 NOTE — Telephone Encounter (Signed)
I called and spoke to wife.  Pt scheduled for MRI 01-11-2023 at 3:20pm MRI brain.  Megan NP did discuss with Dr. Marjory Lies and would like to have MRI brain first and pending results may proceed with oother imaging.  She verbalized understanding.

## 2022-12-16 NOTE — Telephone Encounter (Signed)
-----   Message from Butch Penny, NP sent at 12/15/2022  3:35 PM EDT ----- Please let patient know that after discussing with Dr. Marjory Lies we will proceed with MRI of the brain first pending the recent results he may consider additional imaging

## 2023-01-11 ENCOUNTER — Other Ambulatory Visit: Payer: Medicare Other

## 2023-01-16 ENCOUNTER — Other Ambulatory Visit: Payer: Self-pay | Admitting: Neurology

## 2023-01-18 ENCOUNTER — Ambulatory Visit: Payer: Medicare Other | Admitting: Neurology

## 2023-01-24 ENCOUNTER — Other Ambulatory Visit: Payer: Self-pay | Admitting: Neurology

## 2023-02-15 ENCOUNTER — Ambulatory Visit
Admission: RE | Admit: 2023-02-15 | Discharge: 2023-02-15 | Disposition: A | Discharge status: Home / Self Care | Payer: Medicare Other | Source: Ambulatory Visit | Attending: Adult Health | Admitting: Adult Health

## 2023-02-15 DIAGNOSIS — R4781 Slurred speech: Secondary | ICD-10-CM | POA: Diagnosis not present

## 2023-02-15 MED ORDER — GADOPICLENOL 0.5 MMOL/ML IV SOLN
10.0000 mL | Freq: Once | INTRAVENOUS | Status: AC | PRN
  Administered 2023-02-15: 10 mL via INTRAVENOUS

## 2023-02-17 ENCOUNTER — Telehealth: Payer: Self-pay | Admitting: *Deleted

## 2023-02-17 NOTE — Telephone Encounter (Signed)
-----   Message from [facility] sent at 02/17/2023  3:16 PM EDT ----- No acute infarcts or abnormalities. Stable.

## 2023-02-17 NOTE — Telephone Encounter (Signed)
Spoke to wife (checked DPR) Gave MRI results wife expressed understanding and thanked me for calling

## 2023-03-11 IMAGING — CT CT RENAL STONE PROTOCOL
2 of 4 series · 16 of 46 positions shown, 18 images · non-contrast
Comparison: None.

CLINICAL DATA: 79-year-old with left flank pain. Nausea and
vomiting.

EXAM:
CT ABDOMEN AND PELVIS WITHOUT CONTRAST
TECHNIQUE: Multidetector CT imaging of the abdomen and pelvis was performed
following the standard protocol without IV contrast.

[Series 2: axial st · axial · 0.87mm/px · z∈[+828,+1258]mm · 13 of 98 slices shown, 15 images]
[im 6/98  soft-tissue]
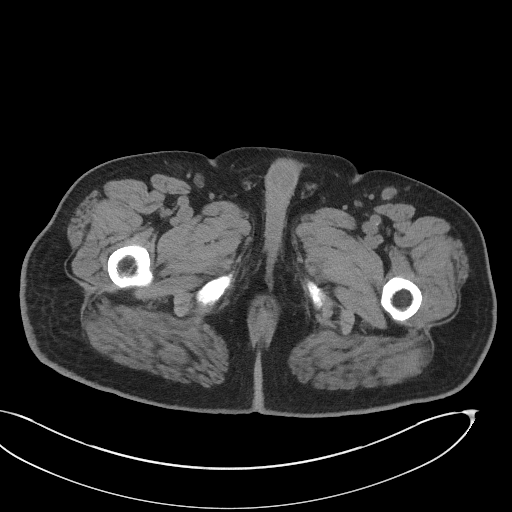
[im 6/98  bone]
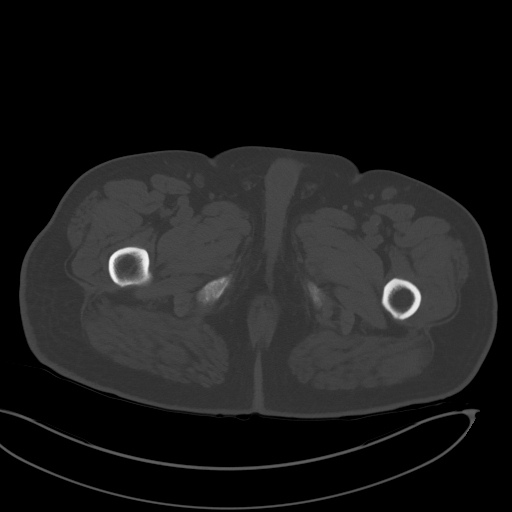
[im 12/98  soft-tissue]
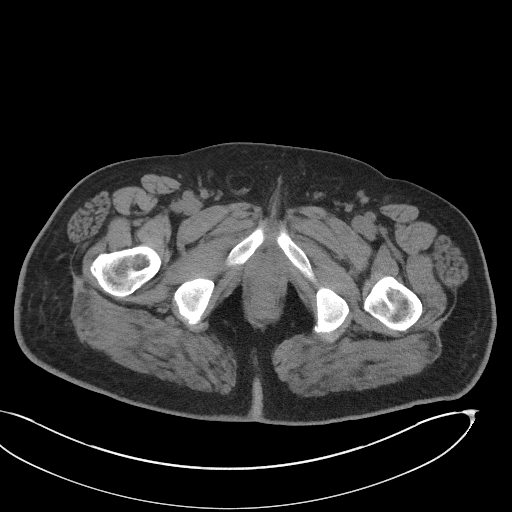
[im 23/98  soft-tissue]
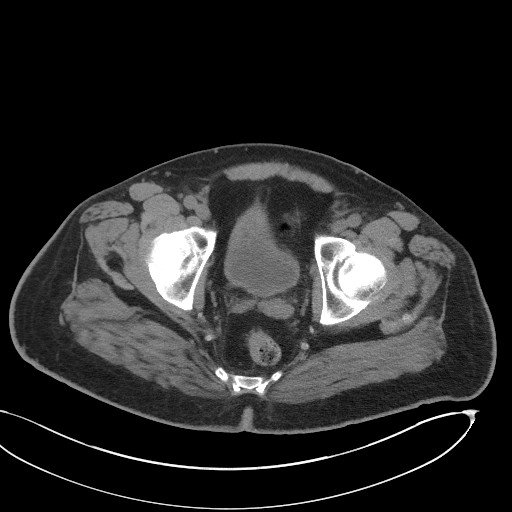
[im 29/98  soft-tissue]
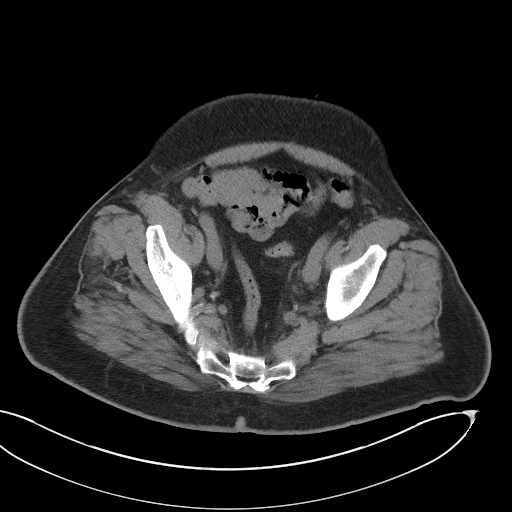
[im 35/98  soft-tissue]
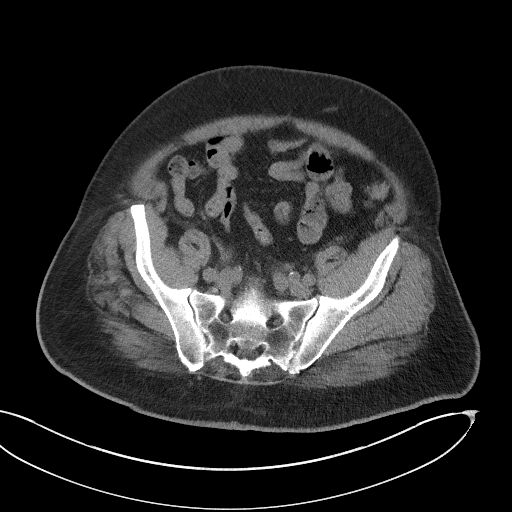
[im 40/98  soft-tissue]
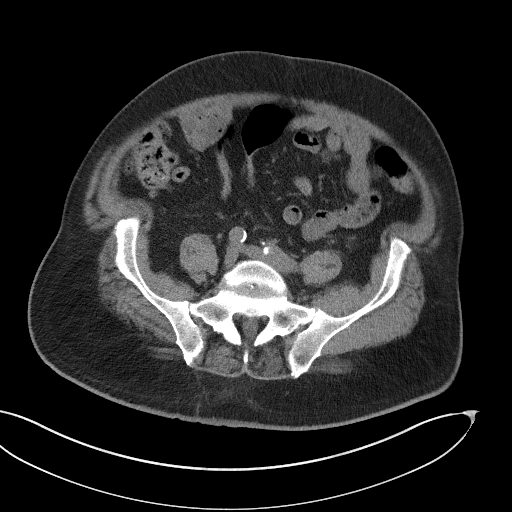
[im 52/98  soft-tissue]
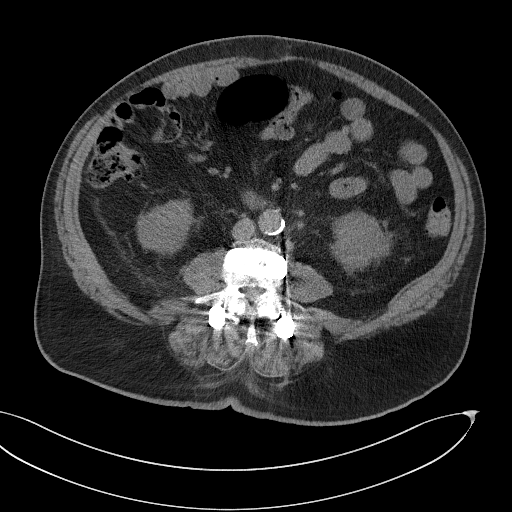
[im 58/98  soft-tissue]
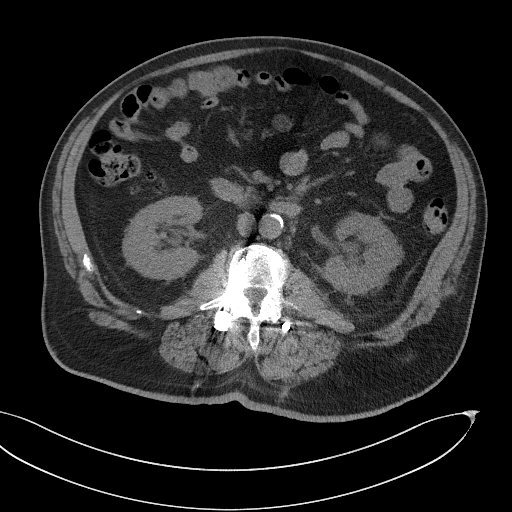
[im 63/98  soft-tissue]
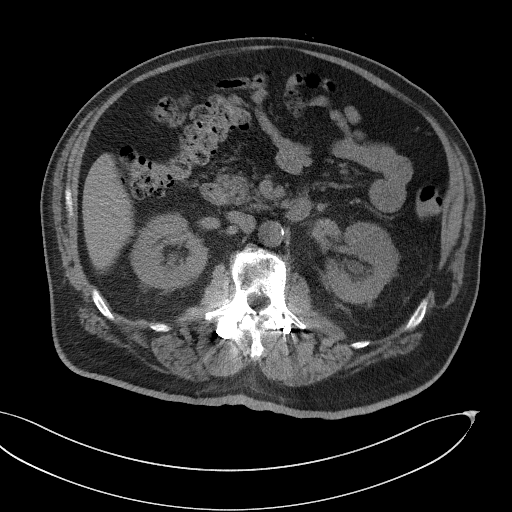
[im 63/98  bone]
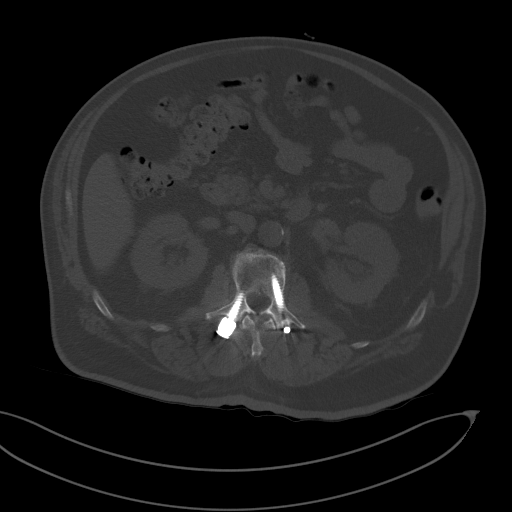
[im 69/98  soft-tissue]
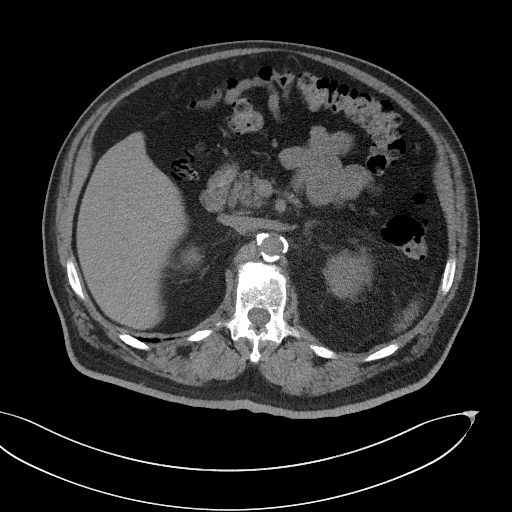
[im 75/98  soft-tissue]
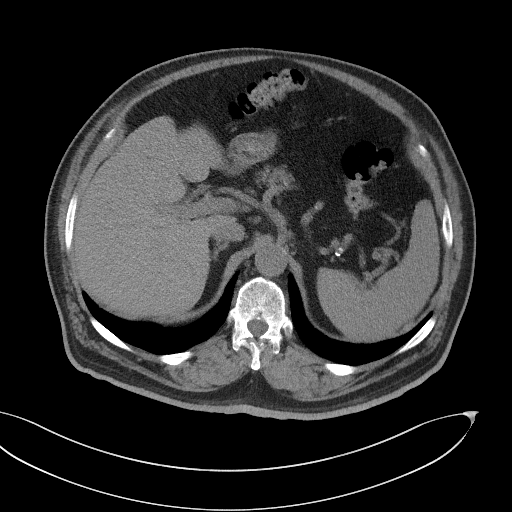
[im 86/98  soft-tissue]
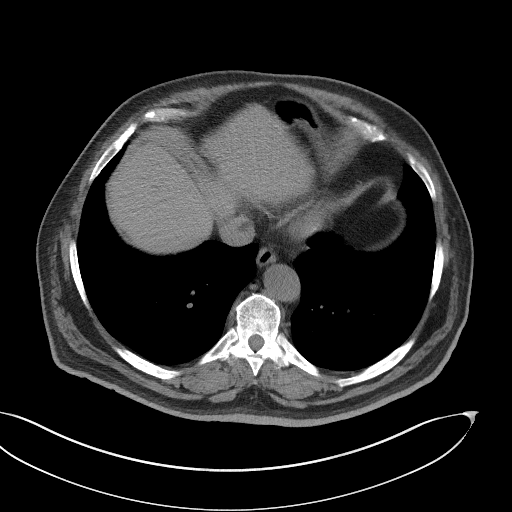
[im 92/98  soft-tissue]
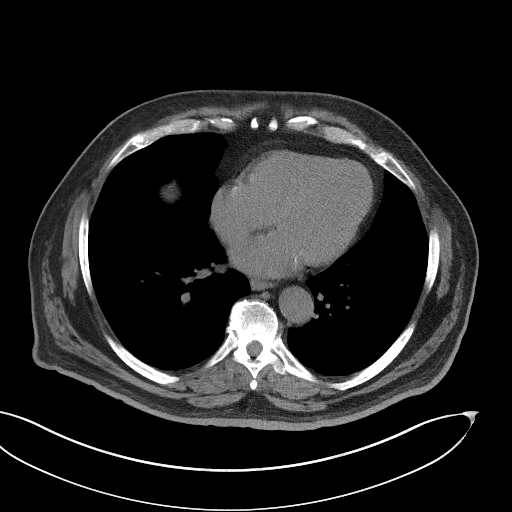

[Series 5: coronal st · coronal · 0.82mm/px · 3 of 121 slices shown]
[im 41/121  soft-tissue]
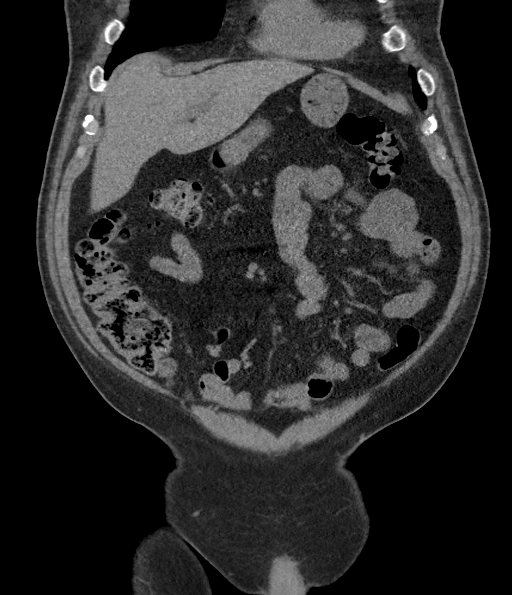
[im 54/121  soft-tissue]
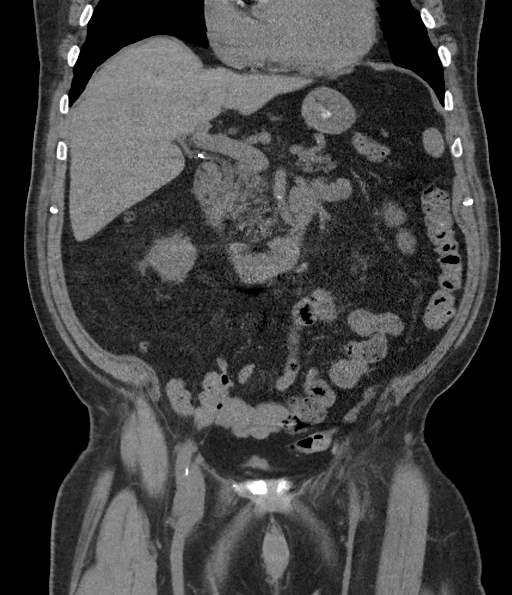
[im 67/121  soft-tissue]
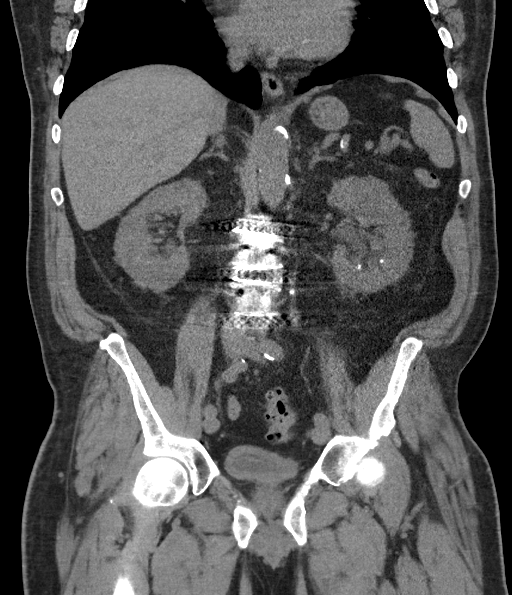

[16 of 46 positions shown; findings below may reference images not displayed]

FINDINGS: Lower chest: No acute airspace disease or pleural effusion.
Borderline cardiomegaly with coronary artery calcifications.

Hepatobiliary: No evidence of focal hepatic abnormality on this
unenhanced exam. Clips in the gallbladder fossa postcholecystectomy.
No biliary dilatation.

Pancreas: No ductal dilatation or inflammation.

Spleen: Normal in size without focal abnormality.

Adrenals/Urinary Tract: No adrenal nodule. There is an obstructing 7
x 5 mm stone in the left mid ureter (at the level of L4) with mild
proximal hydroureteronephrosis. Mild perinephric edema. There are
multiple, at least 6, punctate nonobstructing stones in the left
kidney. There is no right hydronephrosis. At least 4 punctate
nonobstructing stones in the right kidney. No right ureteral
calculi. Urinary bladder is near completely empty. No bladder stone.

Stomach/Bowel: Decompressed stomach. Tiny hiatal hernia. No small
bowel obstruction or inflammation. Normal appendix. Colonic
diverticulosis is more prominent in the right colon, no acute
diverticulitis. Sigmoid colon is redundant. There is no colonic wall
thickening or pericolonic edema.

Vascular/Lymphatic: Aorto bi-iliac atherosclerosis. No aortic
aneurysm. No enlarged lymph nodes in the abdomen or pelvis.

Reproductive: Enlarged prostate gland spans 5.3 cm.

Other: No ascites or free air. There is a small fat containing
umbilical hernia. Fat in both inguinal canals. These a left lateral
lumbar hernia with hernia neck measuring 19 mm, series 2, image 36.

Musculoskeletal: Posterior lumbar fusion from L2 through L5. There
are no acute or suspicious osseous abnormalities.
IMPRESSION: 1. Obstructing 7 x 5 mm stone in the left mid ureter with mild
hydronephrosis.
2. Multiple nonobstructing stones in both kidneys.
3. Colonic diverticulosis without acute inflammation.
4. Enlarged prostate gland.
5. Left lateral lumbar hernia containing fat.

Aortic Atherosclerosis ([id number].O).

## 2023-04-12 ENCOUNTER — Encounter: Payer: Self-pay | Admitting: Neurology

## 2023-04-12 ENCOUNTER — Ambulatory Visit (INDEPENDENT_AMBULATORY_CARE_PROVIDER_SITE_OTHER): Payer: Medicare Other | Admitting: Neurology

## 2023-04-12 VITALS — BP 169/78 | HR 74 | Ht 68.0 in | Wt 214.0 lb

## 2023-04-12 DIAGNOSIS — E1159 Type 2 diabetes mellitus with other circulatory complications: Secondary | ICD-10-CM | POA: Diagnosis not present

## 2023-04-12 DIAGNOSIS — R269 Unspecified abnormalities of gait and mobility: Secondary | ICD-10-CM | POA: Diagnosis not present

## 2023-04-12 DIAGNOSIS — I152 Hypertension secondary to endocrine disorders: Secondary | ICD-10-CM | POA: Diagnosis not present

## 2023-04-12 DIAGNOSIS — I10 Essential (primary) hypertension: Secondary | ICD-10-CM | POA: Diagnosis not present

## 2023-04-12 DIAGNOSIS — F03B0 Unspecified dementia, moderate, without behavioral disturbance, psychotic disturbance, mood disturbance, and anxiety: Secondary | ICD-10-CM

## 2023-04-12 DIAGNOSIS — Z299 Encounter for prophylactic measures, unspecified: Secondary | ICD-10-CM | POA: Diagnosis not present

## 2023-04-12 DIAGNOSIS — R32 Unspecified urinary incontinence: Secondary | ICD-10-CM | POA: Diagnosis not present

## 2023-04-12 NOTE — Progress Notes (Signed)
ASSESSMENT AND PLAN 82 y.o. year old male    Dementia with no agitations Gait abnormality Urinary incontinence  MRI of the brain showed generalized atrophy, relative ventriculomegaly, we even underwent large-volume lumbar puncture in February 2024, to rule out normal pressure hydrocephalus, he did not have any significant improvement following lumbar puncture,  Slow decline in functional status,  Most consistent with central nervous system degenerative disorder, likely Alzheimer's disease,  Encourage moderate exercise, sleeping according to circadian rhythm,  Only return to clinic for new issues      DIAGNOSTIC DATA (LABS, IMAGING, TESTING) - I reviewed patient records, labs, notes, testing and imaging myself where available.  HISTORY OF PRESENT ILLNESS:  Julian Dominguez is a 82 year old male, seen in request by Dr. Barnett Abu, for evaluation of gait abnormality, excessive fatigue, initial evaluation was on October 01, 2021, his primary care physician is Dr. Sherril Croon, [facility]     I reviewed and summarized the referring note. PMhx. DM x 20 years HLD HTN Kidney stone Lumbar decompression, did help his low back pain, Right ankle fracture surgery Cervical decompression, right cervical radiculopathy Left shoulder replacement   [place], used to be very active, raise cows, he had a gradual onset gait abnormality over past couple years, especially since September 2022, he contributed to low back pain, was noted to difficult to take a big stride, his gait is become unsteady  He is more sedentary in the winter, complains of excessive daytime sleepiness, and take frequent Naps, he denies bilateral hands tremor, no loss of sense of smell, no REM sleep disorder,   I personally reviewed MRI of lumbar spine with without contrast September 21, 2021, progressive adjacent mild to lumbar degenerative changes, moderate to severe right foraminal stenosis at L1-2,  L2-5 PLIF    UPDATE Jul 05 2022: He is accompanied by his wife, reports slow progression, worsening gait abnormality, has less control of his legs, fell few times, fairly symmetric bilateral leg difficulty, denies upper extremity involvement   Wife also reported that he has excessive daytime sleepiness, difficulty relaxing and sleep at nighttime,   He also had increased bladder accident,    UPDATE Aug 19 2022: He is on schedule for fluoroscopy guided large-volume lumbar puncture, for evaluation of possible normal pressure hydrocephalus, with his continued progressive memory loss, gait abnormality, urinary incontinence He is accompanied by his wife and daughter at today's clinical visit, I saw him immediately before and after lumbar puncture, there was no significant change in his gait, that was recorded in his wife's cell phone, MoCA examination was 16/30 at previous examination, immediately post LP was 14/30,  UPDATE Sept 24th 2024: He is with his wife at today's visit, continue to decline slowly, less active, able to walk with unsteady gait, have not fallen recently, worsening urinary incontinence, circadian rhythm dysregulation, go to sleep at 1 AM, wake up until 1 PM, fair appetite  MRI of the brain July 2024, with without contrast, generalized atrophy, relative ventriculomegaly, small vessel disease, no acute abnormality   PHYSICAL EXAM  Vitals:   04/12/23 1457 04/12/23 1501  BP: (!) 182/79 (!) 169/78  Pulse: 74   Weight: 214 lb (97.1 kg)   Height: 5\' 8"  (1.727 m)    Body mass index is 32.54 kg/m.   PHYSICAL EXAMNIATION:  Gen: NAD, conversant, well nourised, well groomed  Cardiovascular: Regular rate rhythm, no peripheral edema, warm, nontender. Eyes: Conjunctivae clear without exudates or hemorrhage Neck: Supple, no carotid bruits. Pulmonary: Clear to auscultation bilaterally   NEUROLOGICAL EXAM:  MENTAL STATUS: Speech/cognition: Rely on his wife to provide  history, not oriented to year and month, know  his age,  CRANIAL NERVES: CN II: Visual fields are full to confrontation.  Pupils are round equal and briskly reactive to light. CN III, IV, VI: extraocular movement are normal. No ptosis. CN V: Facial sensation is intact to pinprick in all 3 divisions bilaterally. Corneal responses are intact.  CN VII: Face is symmetric with normal eye closure and smile. CN VIII: Hearing is normal to casual conversation CN IX, X: Palate elevates symmetrically. Phonation is normal. CN XI: Head turning and shoulder shrug are intact CN XII: Tongue is midline with normal movements and no atrophy.  MOTOR: Normal strength, no significant rigidity bradykinesia  REFLEXES: Reflexes are 1 and symmetric at the biceps, triceps, knees, and ankles.   SENSORY: Intact to light touch,  COORDINATION: Rapid alternating movements and fine finger movements are intact. There is no dysmetria on finger-to-nose and heel-knee-shin.    GAIT/STANCE: Push-up to get up from seated position, wide-based, cautious    REVIEW OF SYSTEMS: Out of a complete 14 system review of symptoms, the patient complains only of the following symptoms, and all other reviewed systems are negative.  ALLERGIES: Allergies  Allergen Reactions   Morphine And Codeine Nausea And Vomiting    HOME MEDICATIONS: Outpatient Medications Prior to Visit  Medication Sig Dispense Refill   Ascorbic Acid (VITAMIN C) 1000 MG tablet Take 1,000 mg by mouth daily.       aspirin 81 MG chewable tablet Chew 81 mg by mouth daily.     diclofenac Sodium (VOLTAREN) 1 % GEL Apply topically as needed.     ferrous sulfate 325 (65 FE) MG EC tablet Take 325 mg by mouth daily with breakfast.     Garlic 1000 MG CAPS Take 2,000 mg by mouth daily.      glimepiride (AMARYL) 2 MG tablet Take 2 mg by mouth 2 (two) times daily.     glucosamine-chondroitin 500-400 MG tablet Take 2 tablets by mouth daily.     ibuprofen (ADVIL) 200 MG  tablet Take 400 mg by mouth every 6 (six) hours as needed for moderate pain.     metFORMIN HCl ER 500 MG/5ML SRER Take 500 mg by mouth daily after breakfast.     mirabegron ER (MYRBETRIQ) 25 MG TB24 tablet Take 25 mg by mouth daily.     Multiple Vitamin (MULTIVITAMIN) tablet Take 1 tablet by mouth daily.       naproxen sodium (ALEVE) 220 MG tablet Take 220 mg by mouth.     Omega-3 Fatty Acids (FISH OIL) 1000 MG CPDR Take 1,200 mg by mouth daily after breakfast. 2 pills in am     rosuvastatin (CRESTOR) 5 MG tablet Take 5 mg by mouth once a week. monday     valsartan (DIOVAN) 40 MG tablet Take 40 mg by mouth daily.     vitamin E 400 UNIT capsule Take 400 Units by mouth daily.     oxybutynin (DITROPAN XL) 10 MG 24 hr tablet Take 1 tablet (10 mg total) by mouth at bedtime. (Patient not taking: Reported on 04/12/2023) 30 tablet 11   butalbital-acetaminophen-caffeine (FIORICET) 50-325-40 MG tablet Take 1 tablet by mouth every 8 (eight) hours as needed for headache. 20 tablet 0  oxyCODONE-acetaminophen (PERCOCET/ROXICET) 5-325 MG tablet Take 1 tablet by mouth every 6 (six) hours as needed for severe pain. 10 tablet 0   No facility-administered medications prior to visit.    PAST MEDICAL HISTORY: Past Medical History:  Diagnosis Date   Arthritis    hands and back   Cataract    Diverticulosis    at 04-28-11 colon    GERD (gastroesophageal reflux disease)    History of heart murmur in childhood    History of kidney stones    Hyperlipidemia    takes red yeast rice    Hypertension    Tubular adenoma of colon 04/28/2011   Type 2 dm    Wears hearing aid in both ears 02/25/2021    PAST SURGICAL HISTORY: Past Surgical History:  Procedure Laterality Date   ANKLE FRACTURE SURGERY Right 1996   hardware   ANTERIOR LAT LUMBAR FUSION Left 02/13/2019   Procedure: Left side Lumbar two-three Lumbar three-four Lumbar four-five Anterolateral decompression, posterior pedicle screw fixation;  Surgeon:  Barnett Abu, MD;  Location: [facility] OR;  Service: Neurosurgery;  Laterality: Left;   APPLICATION OF ROBOTIC ASSISTANCE FOR SPINAL PROCEDURE N/A 02/13/2019   Procedure: APPLICATION OF ROBOTIC ASSISTANCE FOR SPINAL PROCEDURE;  Surgeon: Barnett Abu, MD;  Location: [facility] OR;  Service: Neurosurgery;  Laterality: N/A;   CATARACT EXTRACTION, BILATERAL  2011   CHOLECYSTECTOMY  1997   COLONOSCOPY  2017   cyst removed from finger     yrs ago   EYE SURGERY     bilateral cataract surgery with lens implants    LUMBAR PERCUTANEOUS PEDICLE SCREW 3 LEVEL N/A 02/13/2019   Procedure: LUMBAR PERCUTANEOUS PEDICLE SCREW LUMBAR TWO TO LUMBAR FIVE;  Surgeon: Barnett Abu, MD;  Location: [facility] OR;  Service: Neurosurgery;  Laterality: N/A;   NECK SURGERY  2018   POLYPECTOMY     REVERSE SHOULDER ARTHROPLASTY Left 06/26/2019   Procedure: REVERSE SHOULDER ARTHROPLASTY;  Surgeon: Francena Hanly, MD;  Location: [facility];  Service: Orthopedics;  Laterality: Left;     FAMILY HISTORY: Family History  Problem Relation Age of Onset   Colon cancer Father 59   Stomach cancer Maternal Grandfather 61   Stomach cancer Maternal Aunt    Colon cancer Maternal Uncle    Colon cancer Cousin    Colon cancer Cousin    Colon polyps Neg Hx    Rectal cancer Neg Hx    Esophageal cancer Neg Hx     SOCIAL HISTORY: Social History   Socioeconomic History   Marital status: Married    Spouse name: Not on file   Number of children: 2   Years of education: Not on file   Highest education level: Not on file  Occupational History   Occupation: Retired  Tobacco Use   Smoking status: Former    Current packs/day: 0.00    Types: Cigarettes    Quit date: 04/13/1969    Years since quitting: 54.0   Smokeless tobacco: Current    Types: Chew  Vaping Use   Vaping status: Never Used  Substance and Sexual Activity   Alcohol use: No   Drug use: No   Sexual activity: Not on file  Other Topics Concern   Not on file  Social History  Narrative   Not on file   Social Determinants of Health   Financial Resource Strain: Not on file  Food Insecurity: Not on file  Transportation Needs: Not on file  Physical Activity: Not on file  Stress: Not  on file  Social Connections: Not on file  Intimate Partner Violence: Not on file     Levert Feinstein, M.D. Ph.D.  [facility] [address] [place], [place] [address] Phone: [phone] Fax:      [phone]

## 2023-05-04 DIAGNOSIS — Z23 Encounter for immunization: Secondary | ICD-10-CM | POA: Diagnosis not present

## 2023-05-16 DIAGNOSIS — N3941 Urge incontinence: Secondary | ICD-10-CM | POA: Diagnosis not present

## 2023-06-22 DIAGNOSIS — Z79899 Other long term (current) drug therapy: Secondary | ICD-10-CM | POA: Diagnosis not present

## 2023-06-22 DIAGNOSIS — F17210 Nicotine dependence, cigarettes, uncomplicated: Secondary | ICD-10-CM | POA: Diagnosis not present

## 2023-06-22 DIAGNOSIS — Z125 Encounter for screening for malignant neoplasm of prostate: Secondary | ICD-10-CM | POA: Diagnosis not present

## 2023-06-22 DIAGNOSIS — Z0000 Encounter for general adult medical examination without abnormal findings: Secondary | ICD-10-CM | POA: Diagnosis not present

## 2023-06-22 DIAGNOSIS — R5383 Other fatigue: Secondary | ICD-10-CM | POA: Diagnosis not present

## 2023-06-22 DIAGNOSIS — E1165 Type 2 diabetes mellitus with hyperglycemia: Secondary | ICD-10-CM | POA: Diagnosis not present

## 2023-06-22 DIAGNOSIS — I10 Essential (primary) hypertension: Secondary | ICD-10-CM | POA: Diagnosis not present

## 2023-06-22 DIAGNOSIS — E7800 Pure hypercholesterolemia, unspecified: Secondary | ICD-10-CM | POA: Diagnosis not present

## 2023-06-22 DIAGNOSIS — Z7189 Other specified counseling: Secondary | ICD-10-CM | POA: Diagnosis not present

## 2023-06-22 DIAGNOSIS — Z6832 Body mass index (BMI) 32.0-32.9, adult: Secondary | ICD-10-CM | POA: Diagnosis not present

## 2023-06-22 DIAGNOSIS — Z1331 Encounter for screening for depression: Secondary | ICD-10-CM | POA: Diagnosis not present

## 2023-06-22 DIAGNOSIS — Z299 Encounter for prophylactic measures, unspecified: Secondary | ICD-10-CM | POA: Diagnosis not present

## 2023-06-22 DIAGNOSIS — Z1339 Encounter for screening examination for other mental health and behavioral disorders: Secondary | ICD-10-CM | POA: Diagnosis not present

## 2023-08-16 DIAGNOSIS — R609 Edema, unspecified: Secondary | ICD-10-CM | POA: Diagnosis not present

## 2023-08-16 DIAGNOSIS — R2689 Other abnormalities of gait and mobility: Secondary | ICD-10-CM | POA: Diagnosis not present

## 2023-08-16 DIAGNOSIS — E785 Hyperlipidemia, unspecified: Secondary | ICD-10-CM | POA: Diagnosis not present

## 2023-08-16 DIAGNOSIS — Z7984 Long term (current) use of oral hypoglycemic drugs: Secondary | ICD-10-CM | POA: Diagnosis not present

## 2023-08-16 DIAGNOSIS — R9089 Other abnormal findings on diagnostic imaging of central nervous system: Secondary | ICD-10-CM | POA: Diagnosis not present

## 2023-08-16 DIAGNOSIS — Z885 Allergy status to narcotic agent status: Secondary | ICD-10-CM | POA: Diagnosis not present

## 2023-08-16 DIAGNOSIS — Z9049 Acquired absence of other specified parts of digestive tract: Secondary | ICD-10-CM | POA: Diagnosis not present

## 2023-08-16 DIAGNOSIS — I10 Essential (primary) hypertension: Secondary | ICD-10-CM | POA: Diagnosis present

## 2023-08-16 DIAGNOSIS — E119 Type 2 diabetes mellitus without complications: Secondary | ICD-10-CM | POA: Diagnosis present

## 2023-08-16 DIAGNOSIS — I672 Cerebral atherosclerosis: Secondary | ICD-10-CM | POA: Diagnosis not present

## 2023-08-16 DIAGNOSIS — R079 Chest pain, unspecified: Secondary | ICD-10-CM | POA: Diagnosis not present

## 2023-08-16 DIAGNOSIS — Z1152 Encounter for screening for COVID-19: Secondary | ICD-10-CM | POA: Diagnosis not present

## 2023-08-16 DIAGNOSIS — W19XXXA Unspecified fall, initial encounter: Secondary | ICD-10-CM | POA: Diagnosis not present

## 2023-08-16 DIAGNOSIS — F17220 Nicotine dependence, chewing tobacco, uncomplicated: Secondary | ICD-10-CM | POA: Diagnosis present

## 2023-08-16 DIAGNOSIS — R2681 Unsteadiness on feet: Secondary | ICD-10-CM | POA: Diagnosis present

## 2023-08-16 DIAGNOSIS — R531 Weakness: Secondary | ICD-10-CM | POA: Diagnosis not present

## 2023-08-16 DIAGNOSIS — G939 Disorder of brain, unspecified: Secondary | ICD-10-CM | POA: Diagnosis not present

## 2023-08-16 DIAGNOSIS — F0390 Unspecified dementia without behavioral disturbance: Secondary | ICD-10-CM | POA: Diagnosis present

## 2023-08-16 DIAGNOSIS — I639 Cerebral infarction, unspecified: Secondary | ICD-10-CM | POA: Diagnosis not present

## 2023-08-16 DIAGNOSIS — Z7982 Long term (current) use of aspirin: Secondary | ICD-10-CM | POA: Diagnosis not present

## 2023-08-16 DIAGNOSIS — R102 Pelvic and perineal pain: Secondary | ICD-10-CM | POA: Diagnosis not present

## 2023-08-16 DIAGNOSIS — Z79899 Other long term (current) drug therapy: Secondary | ICD-10-CM | POA: Diagnosis not present

## 2023-08-17 DIAGNOSIS — G939 Disorder of brain, unspecified: Secondary | ICD-10-CM | POA: Diagnosis not present

## 2023-08-17 DIAGNOSIS — R531 Weakness: Secondary | ICD-10-CM | POA: Diagnosis present

## 2023-08-17 DIAGNOSIS — R2689 Other abnormalities of gait and mobility: Secondary | ICD-10-CM | POA: Diagnosis not present

## 2023-08-17 DIAGNOSIS — R9089 Other abnormal findings on diagnostic imaging of central nervous system: Secondary | ICD-10-CM | POA: Diagnosis not present

## 2023-08-17 DIAGNOSIS — F0390 Unspecified dementia without behavioral disturbance: Secondary | ICD-10-CM | POA: Diagnosis present

## 2023-08-17 DIAGNOSIS — I639 Cerebral infarction, unspecified: Secondary | ICD-10-CM | POA: Diagnosis not present

## 2023-08-17 DIAGNOSIS — R2681 Unsteadiness on feet: Secondary | ICD-10-CM | POA: Diagnosis present

## 2023-08-17 DIAGNOSIS — F17220 Nicotine dependence, chewing tobacco, uncomplicated: Secondary | ICD-10-CM | POA: Diagnosis present

## 2023-08-17 DIAGNOSIS — Z7984 Long term (current) use of oral hypoglycemic drugs: Secondary | ICD-10-CM | POA: Diagnosis not present

## 2023-08-17 DIAGNOSIS — Z1152 Encounter for screening for COVID-19: Secondary | ICD-10-CM | POA: Diagnosis not present

## 2023-08-17 DIAGNOSIS — E785 Hyperlipidemia, unspecified: Secondary | ICD-10-CM | POA: Diagnosis not present

## 2023-08-17 DIAGNOSIS — Z79899 Other long term (current) drug therapy: Secondary | ICD-10-CM | POA: Diagnosis not present

## 2023-08-17 DIAGNOSIS — E119 Type 2 diabetes mellitus without complications: Secondary | ICD-10-CM | POA: Diagnosis present

## 2023-08-17 DIAGNOSIS — Z9049 Acquired absence of other specified parts of digestive tract: Secondary | ICD-10-CM | POA: Diagnosis not present

## 2023-08-17 DIAGNOSIS — Z7982 Long term (current) use of aspirin: Secondary | ICD-10-CM | POA: Diagnosis not present

## 2023-08-17 DIAGNOSIS — Z885 Allergy status to narcotic agent status: Secondary | ICD-10-CM | POA: Diagnosis not present

## 2023-08-17 DIAGNOSIS — I10 Essential (primary) hypertension: Secondary | ICD-10-CM | POA: Diagnosis present

## 2023-08-25 DIAGNOSIS — G319 Degenerative disease of nervous system, unspecified: Secondary | ICD-10-CM | POA: Diagnosis not present

## 2023-08-25 DIAGNOSIS — G919 Hydrocephalus, unspecified: Secondary | ICD-10-CM | POA: Diagnosis not present

## 2023-08-25 DIAGNOSIS — I700 Atherosclerosis of aorta: Secondary | ICD-10-CM | POA: Diagnosis not present

## 2023-08-25 DIAGNOSIS — R600 Localized edema: Secondary | ICD-10-CM | POA: Diagnosis not present

## 2023-08-25 DIAGNOSIS — Z299 Encounter for prophylactic measures, unspecified: Secondary | ICD-10-CM | POA: Diagnosis not present

## 2023-08-25 DIAGNOSIS — Z09 Encounter for follow-up examination after completed treatment for conditions other than malignant neoplasm: Secondary | ICD-10-CM | POA: Diagnosis not present

## 2023-08-25 DIAGNOSIS — I10 Essential (primary) hypertension: Secondary | ICD-10-CM | POA: Diagnosis not present

## 2023-08-31 DIAGNOSIS — Z7984 Long term (current) use of oral hypoglycemic drugs: Secondary | ICD-10-CM | POA: Diagnosis not present

## 2023-08-31 DIAGNOSIS — E119 Type 2 diabetes mellitus without complications: Secondary | ICD-10-CM | POA: Diagnosis not present

## 2023-08-31 DIAGNOSIS — Z9181 History of falling: Secondary | ICD-10-CM | POA: Diagnosis not present

## 2023-08-31 DIAGNOSIS — Z8781 Personal history of (healed) traumatic fracture: Secondary | ICD-10-CM | POA: Diagnosis not present

## 2023-08-31 DIAGNOSIS — Z8673 Personal history of transient ischemic attack (TIA), and cerebral infarction without residual deficits: Secondary | ICD-10-CM | POA: Diagnosis not present

## 2023-08-31 DIAGNOSIS — F0390 Unspecified dementia without behavioral disturbance: Secondary | ICD-10-CM | POA: Diagnosis not present

## 2023-08-31 DIAGNOSIS — I10 Essential (primary) hypertension: Secondary | ICD-10-CM | POA: Diagnosis not present

## 2023-08-31 DIAGNOSIS — J101 Influenza due to other identified influenza virus with other respiratory manifestations: Secondary | ICD-10-CM | POA: Diagnosis not present

## 2023-08-31 DIAGNOSIS — R32 Unspecified urinary incontinence: Secondary | ICD-10-CM | POA: Diagnosis not present

## 2023-08-31 DIAGNOSIS — R2681 Unsteadiness on feet: Secondary | ICD-10-CM | POA: Diagnosis not present

## 2023-08-31 DIAGNOSIS — R531 Weakness: Secondary | ICD-10-CM | POA: Diagnosis not present

## 2023-08-31 DIAGNOSIS — E785 Hyperlipidemia, unspecified: Secondary | ICD-10-CM | POA: Diagnosis not present

## 2023-09-01 DIAGNOSIS — I10 Essential (primary) hypertension: Secondary | ICD-10-CM | POA: Diagnosis not present

## 2023-09-01 DIAGNOSIS — L509 Urticaria, unspecified: Secondary | ICD-10-CM | POA: Diagnosis not present

## 2023-09-01 DIAGNOSIS — Z299 Encounter for prophylactic measures, unspecified: Secondary | ICD-10-CM | POA: Diagnosis not present

## 2023-09-01 DIAGNOSIS — G919 Hydrocephalus, unspecified: Secondary | ICD-10-CM | POA: Diagnosis not present

## 2023-09-01 DIAGNOSIS — I700 Atherosclerosis of aorta: Secondary | ICD-10-CM | POA: Diagnosis not present

## 2023-09-01 DIAGNOSIS — G319 Degenerative disease of nervous system, unspecified: Secondary | ICD-10-CM | POA: Diagnosis not present

## 2023-09-07 DIAGNOSIS — R2681 Unsteadiness on feet: Secondary | ICD-10-CM | POA: Diagnosis not present

## 2023-09-07 DIAGNOSIS — J101 Influenza due to other identified influenza virus with other respiratory manifestations: Secondary | ICD-10-CM | POA: Diagnosis not present

## 2023-09-07 DIAGNOSIS — I10 Essential (primary) hypertension: Secondary | ICD-10-CM | POA: Diagnosis not present

## 2023-09-07 DIAGNOSIS — F0390 Unspecified dementia without behavioral disturbance: Secondary | ICD-10-CM | POA: Diagnosis not present

## 2023-09-07 DIAGNOSIS — E119 Type 2 diabetes mellitus without complications: Secondary | ICD-10-CM | POA: Diagnosis not present

## 2023-09-07 DIAGNOSIS — R531 Weakness: Secondary | ICD-10-CM | POA: Diagnosis not present

## 2023-09-14 DIAGNOSIS — E119 Type 2 diabetes mellitus without complications: Secondary | ICD-10-CM | POA: Diagnosis not present

## 2023-09-14 DIAGNOSIS — F0390 Unspecified dementia without behavioral disturbance: Secondary | ICD-10-CM | POA: Diagnosis not present

## 2023-09-14 DIAGNOSIS — J101 Influenza due to other identified influenza virus with other respiratory manifestations: Secondary | ICD-10-CM | POA: Diagnosis not present

## 2023-09-14 DIAGNOSIS — R531 Weakness: Secondary | ICD-10-CM | POA: Diagnosis not present

## 2023-09-14 DIAGNOSIS — R2681 Unsteadiness on feet: Secondary | ICD-10-CM | POA: Diagnosis not present

## 2023-09-14 DIAGNOSIS — I10 Essential (primary) hypertension: Secondary | ICD-10-CM | POA: Diagnosis not present

## 2023-09-15 DIAGNOSIS — N3941 Urge incontinence: Secondary | ICD-10-CM | POA: Diagnosis not present

## 2023-09-19 DIAGNOSIS — R2681 Unsteadiness on feet: Secondary | ICD-10-CM | POA: Diagnosis not present

## 2023-09-19 DIAGNOSIS — I10 Essential (primary) hypertension: Secondary | ICD-10-CM | POA: Diagnosis not present

## 2023-09-19 DIAGNOSIS — R531 Weakness: Secondary | ICD-10-CM | POA: Diagnosis not present

## 2023-09-19 DIAGNOSIS — F0390 Unspecified dementia without behavioral disturbance: Secondary | ICD-10-CM | POA: Diagnosis not present

## 2023-09-19 DIAGNOSIS — E119 Type 2 diabetes mellitus without complications: Secondary | ICD-10-CM | POA: Diagnosis not present

## 2023-09-19 DIAGNOSIS — J101 Influenza due to other identified influenza virus with other respiratory manifestations: Secondary | ICD-10-CM | POA: Diagnosis not present

## 2023-09-22 DIAGNOSIS — F17210 Nicotine dependence, cigarettes, uncomplicated: Secondary | ICD-10-CM | POA: Diagnosis not present

## 2023-09-22 DIAGNOSIS — E119 Type 2 diabetes mellitus without complications: Secondary | ICD-10-CM | POA: Diagnosis not present

## 2023-09-22 DIAGNOSIS — E1159 Type 2 diabetes mellitus with other circulatory complications: Secondary | ICD-10-CM | POA: Diagnosis not present

## 2023-09-22 DIAGNOSIS — J101 Influenza due to other identified influenza virus with other respiratory manifestations: Secondary | ICD-10-CM | POA: Diagnosis not present

## 2023-09-22 DIAGNOSIS — I152 Hypertension secondary to endocrine disorders: Secondary | ICD-10-CM | POA: Diagnosis not present

## 2023-09-22 DIAGNOSIS — E1165 Type 2 diabetes mellitus with hyperglycemia: Secondary | ICD-10-CM | POA: Diagnosis not present

## 2023-09-22 DIAGNOSIS — R2681 Unsteadiness on feet: Secondary | ICD-10-CM | POA: Diagnosis not present

## 2023-09-22 DIAGNOSIS — F0390 Unspecified dementia without behavioral disturbance: Secondary | ICD-10-CM | POA: Diagnosis not present

## 2023-09-22 DIAGNOSIS — R531 Weakness: Secondary | ICD-10-CM | POA: Diagnosis not present

## 2023-09-22 DIAGNOSIS — Z299 Encounter for prophylactic measures, unspecified: Secondary | ICD-10-CM | POA: Diagnosis not present

## 2023-09-22 DIAGNOSIS — I10 Essential (primary) hypertension: Secondary | ICD-10-CM | POA: Diagnosis not present

## 2023-09-22 DIAGNOSIS — G919 Hydrocephalus, unspecified: Secondary | ICD-10-CM | POA: Diagnosis not present

## 2023-09-27 DIAGNOSIS — I10 Essential (primary) hypertension: Secondary | ICD-10-CM | POA: Diagnosis not present

## 2023-09-27 DIAGNOSIS — F0390 Unspecified dementia without behavioral disturbance: Secondary | ICD-10-CM | POA: Diagnosis not present

## 2023-09-27 DIAGNOSIS — J101 Influenza due to other identified influenza virus with other respiratory manifestations: Secondary | ICD-10-CM | POA: Diagnosis not present

## 2023-09-27 DIAGNOSIS — R2681 Unsteadiness on feet: Secondary | ICD-10-CM | POA: Diagnosis not present

## 2023-09-27 DIAGNOSIS — E119 Type 2 diabetes mellitus without complications: Secondary | ICD-10-CM | POA: Diagnosis not present

## 2023-09-27 DIAGNOSIS — R531 Weakness: Secondary | ICD-10-CM | POA: Diagnosis not present

## 2023-09-30 DIAGNOSIS — Z9181 History of falling: Secondary | ICD-10-CM | POA: Diagnosis not present

## 2023-09-30 DIAGNOSIS — Z8673 Personal history of transient ischemic attack (TIA), and cerebral infarction without residual deficits: Secondary | ICD-10-CM | POA: Diagnosis not present

## 2023-09-30 DIAGNOSIS — J101 Influenza due to other identified influenza virus with other respiratory manifestations: Secondary | ICD-10-CM | POA: Diagnosis not present

## 2023-09-30 DIAGNOSIS — E119 Type 2 diabetes mellitus without complications: Secondary | ICD-10-CM | POA: Diagnosis not present

## 2023-09-30 DIAGNOSIS — R531 Weakness: Secondary | ICD-10-CM | POA: Diagnosis not present

## 2023-09-30 DIAGNOSIS — I10 Essential (primary) hypertension: Secondary | ICD-10-CM | POA: Diagnosis not present

## 2023-09-30 DIAGNOSIS — Z8781 Personal history of (healed) traumatic fracture: Secondary | ICD-10-CM | POA: Diagnosis not present

## 2023-09-30 DIAGNOSIS — Z7984 Long term (current) use of oral hypoglycemic drugs: Secondary | ICD-10-CM | POA: Diagnosis not present

## 2023-09-30 DIAGNOSIS — F0390 Unspecified dementia without behavioral disturbance: Secondary | ICD-10-CM | POA: Diagnosis not present

## 2023-09-30 DIAGNOSIS — R2681 Unsteadiness on feet: Secondary | ICD-10-CM | POA: Diagnosis not present

## 2023-09-30 DIAGNOSIS — R32 Unspecified urinary incontinence: Secondary | ICD-10-CM | POA: Diagnosis not present

## 2023-09-30 DIAGNOSIS — E785 Hyperlipidemia, unspecified: Secondary | ICD-10-CM | POA: Diagnosis not present

## 2023-10-12 DIAGNOSIS — I10 Essential (primary) hypertension: Secondary | ICD-10-CM | POA: Diagnosis not present

## 2023-10-12 DIAGNOSIS — F0390 Unspecified dementia without behavioral disturbance: Secondary | ICD-10-CM | POA: Diagnosis not present

## 2023-10-12 DIAGNOSIS — E119 Type 2 diabetes mellitus without complications: Secondary | ICD-10-CM | POA: Diagnosis not present

## 2023-10-12 DIAGNOSIS — J101 Influenza due to other identified influenza virus with other respiratory manifestations: Secondary | ICD-10-CM | POA: Diagnosis not present

## 2023-10-12 DIAGNOSIS — R2681 Unsteadiness on feet: Secondary | ICD-10-CM | POA: Diagnosis not present

## 2023-10-12 DIAGNOSIS — R531 Weakness: Secondary | ICD-10-CM | POA: Diagnosis not present

## 2023-10-24 DIAGNOSIS — R3915 Urgency of urination: Secondary | ICD-10-CM | POA: Diagnosis not present

## 2023-10-28 DIAGNOSIS — J101 Influenza due to other identified influenza virus with other respiratory manifestations: Secondary | ICD-10-CM | POA: Diagnosis not present

## 2023-10-28 DIAGNOSIS — E119 Type 2 diabetes mellitus without complications: Secondary | ICD-10-CM | POA: Diagnosis not present

## 2023-10-28 DIAGNOSIS — F0390 Unspecified dementia without behavioral disturbance: Secondary | ICD-10-CM | POA: Diagnosis not present

## 2023-10-28 DIAGNOSIS — R531 Weakness: Secondary | ICD-10-CM | POA: Diagnosis not present

## 2023-10-28 DIAGNOSIS — R2681 Unsteadiness on feet: Secondary | ICD-10-CM | POA: Diagnosis not present

## 2023-10-28 DIAGNOSIS — I10 Essential (primary) hypertension: Secondary | ICD-10-CM | POA: Diagnosis not present

## 2023-10-30 DIAGNOSIS — R2681 Unsteadiness on feet: Secondary | ICD-10-CM | POA: Diagnosis not present

## 2023-10-30 DIAGNOSIS — E119 Type 2 diabetes mellitus without complications: Secondary | ICD-10-CM | POA: Diagnosis not present

## 2023-10-30 DIAGNOSIS — F0390 Unspecified dementia without behavioral disturbance: Secondary | ICD-10-CM | POA: Diagnosis not present

## 2023-10-30 DIAGNOSIS — R32 Unspecified urinary incontinence: Secondary | ICD-10-CM | POA: Diagnosis not present

## 2023-10-30 DIAGNOSIS — M6281 Muscle weakness (generalized): Secondary | ICD-10-CM | POA: Diagnosis not present

## 2023-10-30 DIAGNOSIS — Z8781 Personal history of (healed) traumatic fracture: Secondary | ICD-10-CM | POA: Diagnosis not present

## 2023-10-30 DIAGNOSIS — I10 Essential (primary) hypertension: Secondary | ICD-10-CM | POA: Diagnosis not present

## 2023-10-30 DIAGNOSIS — E1169 Type 2 diabetes mellitus with other specified complication: Secondary | ICD-10-CM | POA: Diagnosis not present

## 2023-10-30 DIAGNOSIS — E785 Hyperlipidemia, unspecified: Secondary | ICD-10-CM | POA: Diagnosis not present

## 2023-10-30 DIAGNOSIS — Z7984 Long term (current) use of oral hypoglycemic drugs: Secondary | ICD-10-CM | POA: Diagnosis not present

## 2023-10-30 DIAGNOSIS — Z8673 Personal history of transient ischemic attack (TIA), and cerebral infarction without residual deficits: Secondary | ICD-10-CM | POA: Diagnosis not present

## 2023-10-30 DIAGNOSIS — Z9181 History of falling: Secondary | ICD-10-CM | POA: Diagnosis not present

## 2023-11-01 DIAGNOSIS — R2681 Unsteadiness on feet: Secondary | ICD-10-CM | POA: Diagnosis not present

## 2023-11-01 DIAGNOSIS — I10 Essential (primary) hypertension: Secondary | ICD-10-CM | POA: Diagnosis not present

## 2023-11-01 DIAGNOSIS — M6281 Muscle weakness (generalized): Secondary | ICD-10-CM | POA: Diagnosis not present

## 2023-11-01 DIAGNOSIS — R32 Unspecified urinary incontinence: Secondary | ICD-10-CM | POA: Diagnosis not present

## 2023-11-01 DIAGNOSIS — E119 Type 2 diabetes mellitus without complications: Secondary | ICD-10-CM | POA: Diagnosis not present

## 2023-11-01 DIAGNOSIS — F0390 Unspecified dementia without behavioral disturbance: Secondary | ICD-10-CM | POA: Diagnosis not present

## 2023-11-03 DIAGNOSIS — N3941 Urge incontinence: Secondary | ICD-10-CM | POA: Diagnosis not present

## 2023-11-09 DIAGNOSIS — E1165 Type 2 diabetes mellitus with hyperglycemia: Secondary | ICD-10-CM | POA: Diagnosis not present

## 2023-11-09 DIAGNOSIS — Z299 Encounter for prophylactic measures, unspecified: Secondary | ICD-10-CM | POA: Diagnosis not present

## 2023-11-09 DIAGNOSIS — I10 Essential (primary) hypertension: Secondary | ICD-10-CM | POA: Diagnosis not present

## 2023-11-10 DIAGNOSIS — R2681 Unsteadiness on feet: Secondary | ICD-10-CM | POA: Diagnosis not present

## 2023-11-10 DIAGNOSIS — E119 Type 2 diabetes mellitus without complications: Secondary | ICD-10-CM | POA: Diagnosis not present

## 2023-11-10 DIAGNOSIS — M6281 Muscle weakness (generalized): Secondary | ICD-10-CM | POA: Diagnosis not present

## 2023-11-10 DIAGNOSIS — F0390 Unspecified dementia without behavioral disturbance: Secondary | ICD-10-CM | POA: Diagnosis not present

## 2023-11-10 DIAGNOSIS — R32 Unspecified urinary incontinence: Secondary | ICD-10-CM | POA: Diagnosis not present

## 2023-11-10 DIAGNOSIS — I10 Essential (primary) hypertension: Secondary | ICD-10-CM | POA: Diagnosis not present

## 2023-11-11 DIAGNOSIS — H6123 Impacted cerumen, bilateral: Secondary | ICD-10-CM | POA: Diagnosis not present

## 2023-11-11 DIAGNOSIS — I10 Essential (primary) hypertension: Secondary | ICD-10-CM | POA: Diagnosis not present

## 2023-11-11 DIAGNOSIS — Z299 Encounter for prophylactic measures, unspecified: Secondary | ICD-10-CM | POA: Diagnosis not present

## 2023-11-15 DIAGNOSIS — E119 Type 2 diabetes mellitus without complications: Secondary | ICD-10-CM | POA: Diagnosis not present

## 2023-11-15 DIAGNOSIS — I10 Essential (primary) hypertension: Secondary | ICD-10-CM | POA: Diagnosis not present

## 2023-11-15 DIAGNOSIS — R32 Unspecified urinary incontinence: Secondary | ICD-10-CM | POA: Diagnosis not present

## 2023-11-15 DIAGNOSIS — F0390 Unspecified dementia without behavioral disturbance: Secondary | ICD-10-CM | POA: Diagnosis not present

## 2023-11-15 DIAGNOSIS — R2681 Unsteadiness on feet: Secondary | ICD-10-CM | POA: Diagnosis not present

## 2023-11-15 DIAGNOSIS — M6281 Muscle weakness (generalized): Secondary | ICD-10-CM | POA: Diagnosis not present

## 2023-11-17 DIAGNOSIS — R2681 Unsteadiness on feet: Secondary | ICD-10-CM | POA: Diagnosis not present

## 2023-11-17 DIAGNOSIS — M6281 Muscle weakness (generalized): Secondary | ICD-10-CM | POA: Diagnosis not present

## 2023-11-17 DIAGNOSIS — E119 Type 2 diabetes mellitus without complications: Secondary | ICD-10-CM | POA: Diagnosis not present

## 2023-11-17 DIAGNOSIS — I10 Essential (primary) hypertension: Secondary | ICD-10-CM | POA: Diagnosis not present

## 2023-11-22 DIAGNOSIS — R2681 Unsteadiness on feet: Secondary | ICD-10-CM | POA: Diagnosis not present

## 2023-11-22 DIAGNOSIS — R32 Unspecified urinary incontinence: Secondary | ICD-10-CM | POA: Diagnosis not present

## 2023-11-22 DIAGNOSIS — M6281 Muscle weakness (generalized): Secondary | ICD-10-CM | POA: Diagnosis not present

## 2023-11-22 DIAGNOSIS — I10 Essential (primary) hypertension: Secondary | ICD-10-CM | POA: Diagnosis not present

## 2023-11-22 DIAGNOSIS — E119 Type 2 diabetes mellitus without complications: Secondary | ICD-10-CM | POA: Diagnosis not present

## 2023-11-22 DIAGNOSIS — F0390 Unspecified dementia without behavioral disturbance: Secondary | ICD-10-CM | POA: Diagnosis not present

## 2023-11-23 DIAGNOSIS — I10 Essential (primary) hypertension: Secondary | ICD-10-CM | POA: Diagnosis not present

## 2023-11-23 DIAGNOSIS — R32 Unspecified urinary incontinence: Secondary | ICD-10-CM | POA: Diagnosis not present

## 2023-11-23 DIAGNOSIS — M6281 Muscle weakness (generalized): Secondary | ICD-10-CM | POA: Diagnosis not present

## 2023-11-23 DIAGNOSIS — R2681 Unsteadiness on feet: Secondary | ICD-10-CM | POA: Diagnosis not present

## 2023-11-23 DIAGNOSIS — F0390 Unspecified dementia without behavioral disturbance: Secondary | ICD-10-CM | POA: Diagnosis not present

## 2023-11-23 DIAGNOSIS — E119 Type 2 diabetes mellitus without complications: Secondary | ICD-10-CM | POA: Diagnosis not present

## 2023-11-29 DIAGNOSIS — E785 Hyperlipidemia, unspecified: Secondary | ICD-10-CM | POA: Diagnosis not present

## 2023-11-29 DIAGNOSIS — I10 Essential (primary) hypertension: Secondary | ICD-10-CM | POA: Diagnosis not present

## 2023-11-29 DIAGNOSIS — Z8781 Personal history of (healed) traumatic fracture: Secondary | ICD-10-CM | POA: Diagnosis not present

## 2023-11-29 DIAGNOSIS — M6281 Muscle weakness (generalized): Secondary | ICD-10-CM | POA: Diagnosis not present

## 2023-11-29 DIAGNOSIS — R2681 Unsteadiness on feet: Secondary | ICD-10-CM | POA: Diagnosis not present

## 2023-11-29 DIAGNOSIS — E1169 Type 2 diabetes mellitus with other specified complication: Secondary | ICD-10-CM | POA: Diagnosis not present

## 2023-11-29 DIAGNOSIS — R32 Unspecified urinary incontinence: Secondary | ICD-10-CM | POA: Diagnosis not present

## 2023-11-29 DIAGNOSIS — E119 Type 2 diabetes mellitus without complications: Secondary | ICD-10-CM | POA: Diagnosis not present

## 2023-11-29 DIAGNOSIS — Z8673 Personal history of transient ischemic attack (TIA), and cerebral infarction without residual deficits: Secondary | ICD-10-CM | POA: Diagnosis not present

## 2023-11-29 DIAGNOSIS — Z7984 Long term (current) use of oral hypoglycemic drugs: Secondary | ICD-10-CM | POA: Diagnosis not present

## 2023-11-29 DIAGNOSIS — Z9181 History of falling: Secondary | ICD-10-CM | POA: Diagnosis not present

## 2023-11-29 DIAGNOSIS — F0390 Unspecified dementia without behavioral disturbance: Secondary | ICD-10-CM | POA: Diagnosis not present

## 2023-12-06 DIAGNOSIS — R2681 Unsteadiness on feet: Secondary | ICD-10-CM | POA: Diagnosis not present

## 2023-12-06 DIAGNOSIS — E119 Type 2 diabetes mellitus without complications: Secondary | ICD-10-CM | POA: Diagnosis not present

## 2023-12-06 DIAGNOSIS — M6281 Muscle weakness (generalized): Secondary | ICD-10-CM | POA: Diagnosis not present

## 2023-12-06 DIAGNOSIS — I10 Essential (primary) hypertension: Secondary | ICD-10-CM | POA: Diagnosis not present

## 2023-12-06 DIAGNOSIS — R32 Unspecified urinary incontinence: Secondary | ICD-10-CM | POA: Diagnosis not present

## 2023-12-06 DIAGNOSIS — F0390 Unspecified dementia without behavioral disturbance: Secondary | ICD-10-CM | POA: Diagnosis not present

## 2023-12-14 DIAGNOSIS — F0390 Unspecified dementia without behavioral disturbance: Secondary | ICD-10-CM | POA: Diagnosis not present

## 2023-12-14 DIAGNOSIS — R32 Unspecified urinary incontinence: Secondary | ICD-10-CM | POA: Diagnosis not present

## 2023-12-14 DIAGNOSIS — M6281 Muscle weakness (generalized): Secondary | ICD-10-CM | POA: Diagnosis not present

## 2023-12-14 DIAGNOSIS — I10 Essential (primary) hypertension: Secondary | ICD-10-CM | POA: Diagnosis not present

## 2023-12-14 DIAGNOSIS — E119 Type 2 diabetes mellitus without complications: Secondary | ICD-10-CM | POA: Diagnosis not present

## 2023-12-14 DIAGNOSIS — R2681 Unsteadiness on feet: Secondary | ICD-10-CM | POA: Diagnosis not present

## 2023-12-20 DIAGNOSIS — R32 Unspecified urinary incontinence: Secondary | ICD-10-CM | POA: Diagnosis not present

## 2023-12-20 DIAGNOSIS — E119 Type 2 diabetes mellitus without complications: Secondary | ICD-10-CM | POA: Diagnosis not present

## 2023-12-20 DIAGNOSIS — M6281 Muscle weakness (generalized): Secondary | ICD-10-CM | POA: Diagnosis not present

## 2023-12-20 DIAGNOSIS — I10 Essential (primary) hypertension: Secondary | ICD-10-CM | POA: Diagnosis not present

## 2023-12-20 DIAGNOSIS — R2681 Unsteadiness on feet: Secondary | ICD-10-CM | POA: Diagnosis not present

## 2023-12-20 DIAGNOSIS — F0390 Unspecified dementia without behavioral disturbance: Secondary | ICD-10-CM | POA: Diagnosis not present

## 2023-12-26 DIAGNOSIS — E1165 Type 2 diabetes mellitus with hyperglycemia: Secondary | ICD-10-CM | POA: Diagnosis not present

## 2023-12-26 DIAGNOSIS — I10 Essential (primary) hypertension: Secondary | ICD-10-CM | POA: Diagnosis not present

## 2023-12-26 DIAGNOSIS — F0390 Unspecified dementia without behavioral disturbance: Secondary | ICD-10-CM | POA: Diagnosis not present

## 2023-12-26 DIAGNOSIS — Z299 Encounter for prophylactic measures, unspecified: Secondary | ICD-10-CM | POA: Diagnosis not present

## 2023-12-26 DIAGNOSIS — R600 Localized edema: Secondary | ICD-10-CM | POA: Diagnosis not present

## 2023-12-27 DIAGNOSIS — E119 Type 2 diabetes mellitus without complications: Secondary | ICD-10-CM | POA: Diagnosis not present

## 2023-12-27 DIAGNOSIS — I10 Essential (primary) hypertension: Secondary | ICD-10-CM | POA: Diagnosis not present

## 2023-12-27 DIAGNOSIS — F0390 Unspecified dementia without behavioral disturbance: Secondary | ICD-10-CM | POA: Diagnosis not present

## 2023-12-27 DIAGNOSIS — R2681 Unsteadiness on feet: Secondary | ICD-10-CM | POA: Diagnosis not present

## 2023-12-27 DIAGNOSIS — R32 Unspecified urinary incontinence: Secondary | ICD-10-CM | POA: Diagnosis not present

## 2023-12-27 DIAGNOSIS — M6281 Muscle weakness (generalized): Secondary | ICD-10-CM | POA: Diagnosis not present

## 2024-02-06 ENCOUNTER — Encounter (HOSPITAL_COMMUNITY): Payer: Self-pay | Admitting: Emergency Medicine

## 2024-02-06 ENCOUNTER — Emergency Department (HOSPITAL_COMMUNITY)

## 2024-02-06 ENCOUNTER — Other Ambulatory Visit: Payer: Self-pay

## 2024-02-06 ENCOUNTER — Emergency Department (HOSPITAL_COMMUNITY)
Admission: EM | Admit: 2024-02-06 | Discharge: 2024-02-06 | Disposition: A | Discharge status: Home / Self Care | Attending: Emergency Medicine | Admitting: Emergency Medicine

## 2024-02-06 DIAGNOSIS — Z043 Encounter for examination and observation following other accident: Secondary | ICD-10-CM | POA: Diagnosis not present

## 2024-02-06 DIAGNOSIS — S0993XA Unspecified injury of face, initial encounter: Secondary | ICD-10-CM | POA: Diagnosis present

## 2024-02-06 DIAGNOSIS — I10 Essential (primary) hypertension: Secondary | ICD-10-CM | POA: Diagnosis not present

## 2024-02-06 DIAGNOSIS — Z23 Encounter for immunization: Secondary | ICD-10-CM | POA: Insufficient documentation

## 2024-02-06 DIAGNOSIS — W01190A Fall on same level from slipping, tripping and stumbling with subsequent striking against furniture, initial encounter: Secondary | ICD-10-CM | POA: Insufficient documentation

## 2024-02-06 DIAGNOSIS — S0181XA Laceration without foreign body of other part of head, initial encounter: Secondary | ICD-10-CM | POA: Diagnosis not present

## 2024-02-06 DIAGNOSIS — E119 Type 2 diabetes mellitus without complications: Secondary | ICD-10-CM | POA: Diagnosis not present

## 2024-02-06 DIAGNOSIS — I6523 Occlusion and stenosis of bilateral carotid arteries: Secondary | ICD-10-CM | POA: Diagnosis not present

## 2024-02-06 DIAGNOSIS — Z87442 Personal history of urinary calculi: Secondary | ICD-10-CM | POA: Diagnosis not present

## 2024-02-06 DIAGNOSIS — Z7982 Long term (current) use of aspirin: Secondary | ICD-10-CM | POA: Diagnosis not present

## 2024-02-06 DIAGNOSIS — M4801 Spinal stenosis, occipito-atlanto-axial region: Secondary | ICD-10-CM | POA: Diagnosis not present

## 2024-02-06 DIAGNOSIS — M47812 Spondylosis without myelopathy or radiculopathy, cervical region: Secondary | ICD-10-CM | POA: Diagnosis not present

## 2024-02-06 DIAGNOSIS — G9389 Other specified disorders of brain: Secondary | ICD-10-CM | POA: Diagnosis not present

## 2024-02-06 DIAGNOSIS — Z981 Arthrodesis status: Secondary | ICD-10-CM | POA: Diagnosis not present

## 2024-02-06 DIAGNOSIS — W19XXXA Unspecified fall, initial encounter: Secondary | ICD-10-CM

## 2024-02-06 MED ORDER — LIDOCAINE-EPINEPHRINE (PF) 2 %-1:200000 IJ SOLN
10.0000 mL | Freq: Once | INTRAMUSCULAR | Status: AC
  Administered 2024-02-06: 10 mL
  Filled 2024-02-06: qty 20

## 2024-02-06 MED ORDER — TETANUS-DIPHTH-ACELL PERTUSSIS 5-2.5-18.5 LF-MCG/0.5 IM SUSY
0.5000 mL | PREFILLED_SYRINGE | Freq: Once | INTRAMUSCULAR | Status: AC
  Administered 2024-02-06: 0.5 mL via INTRAMUSCULAR
  Filled 2024-02-06: qty 0.5

## 2024-02-06 NOTE — ED Triage Notes (Signed)
 Pt c/o laceration to right temple after falling and hitting a chair tonight. Denies LOC or blood thinners.

## 2024-02-06 NOTE — Discharge Instructions (Signed)
 The sutures can come out in 5 to 7 days.

## 2024-02-06 NOTE — ED Provider Notes (Signed)
 LACERATION REPAIR Performed by: Lonni JONETTA Conger, PA Student Sera Stotelmyre  Authorized by: Lonni JONETTA Conger Consent: Verbal consent obtained. Risks and benefits: risks, benefits and alternatives were discussed Consent given by: patient Patient identity confirmed: provided demographic data Prepped and Draped in normal sterile fashion Wound explored  Laceration Location: Right temporal region  Laceration Length: 3 cm  No Foreign Bodies seen or palpated  Anesthesia: local infiltration  Local anesthetic: lidocaine  2% with epinephrine   Anesthetic total: 3 ml  Irrigation method: syringe Amount of cleaning: standard  Skin closure: 5-0 Prolene  Number of sutures: 4  Technique: Simple interrupted  Patient tolerance: Patient tolerated the procedure well with no immediate complications.    Conger Lonni JONETTA, PA-C 02/06/24 2302    Patsey Lot, MD 02/07/24 1248

## 2024-02-06 NOTE — ED Provider Notes (Signed)
 [facility] Provider Note   CSN: [id number] Arrival date & time: 02/06/24  2018     Patient presents with: Julian Dominguez is a 83 y.o. male.    Fall  Patient presents after fall.  Clemens and hit the chair.  No loss conscious.  Laceration to right temple.  Patient is unsteady at baseline and potentially has Parkinson's per wife.  No other injury.    Past Medical History:  Diagnosis Date   Arthritis    hands and back   Cataract    Diverticulosis    at 04-28-11 colon    GERD (gastroesophageal reflux disease)    History of heart murmur in childhood    History of kidney stones    Hyperlipidemia    takes red yeast rice    Hypertension    Tubular adenoma of colon 04/28/2011   Type 2 dm    Wears hearing aid in both ears 02/25/2021    Prior to Admission medications   Medication Sig Start Date End Date Taking? Authorizing Provider  Ascorbic Acid (VITAMIN C) 1000 MG tablet Take 1,000 mg by mouth daily.      [provider]  aspirin  81 MG chewable tablet Chew 81 mg by mouth daily.    [provider]  diclofenac Sodium (VOLTAREN) 1 % GEL Apply topically as needed.    [provider]  ferrous sulfate 325 (65 FE) MG EC tablet Take 325 mg by mouth daily with breakfast.    [provider]  Garlic 1000 MG CAPS Take 2,000 mg by mouth daily.     [provider]  glimepiride  (AMARYL ) 2 MG tablet Take 2 mg by mouth 2 (two) times daily.    [provider]  glucosamine-chondroitin 500-400 MG tablet Take 2 tablets by mouth daily.    [provider]  ibuprofen (ADVIL) 200 MG tablet Take 400 mg by mouth every 6 (six) hours as needed for moderate pain.    [provider]  metFORMIN HCl ER 500 MG/5ML SRER Take 500 mg by mouth daily after breakfast.    [provider]  mirabegron ER (MYRBETRIQ) 25 MG TB24 tablet Take 25 mg by mouth daily.    [provider]  Multiple Vitamin (MULTIVITAMIN) tablet Take 1 tablet by mouth daily.      [provider]  naproxen sodium (ALEVE) 220 MG tablet Take 220 mg by mouth.    [provider]  Omega-3 Fatty Acids (FISH OIL) 1000 MG CPDR Take 1,200 mg by mouth daily after breakfast. 2 pills in am    [provider]  oxybutynin  (DITROPAN  XL) 10 MG 24 hr tablet Take 1 tablet (10 mg total) by mouth at bedtime. Patient not taking: Reported on 04/12/2023 08/19/22   Onita Duos, MD  rosuvastatin (CRESTOR) 5 MG tablet Take 5 mg by mouth once a week. monday    [provider]  valsartan (DIOVAN) 40 MG tablet Take 40 mg by mouth daily.    [provider]  vitamin E 400 UNIT capsule Take 400 Units by mouth daily.    [provider]    Allergies: Morphine  and codeine    Review of Systems  Updated Vital Signs BP (!) 157/72 (BP Location: Left Arm)   Pulse 69   Temp 98.3 F (36.8 C) (Oral)   Resp 18   Ht 5' 8 (1.727 m)   Wt 97.1 kg   SpO2  98%   BMI 32.55 kg/m   Physical Exam Vitals and nursing note reviewed.  HENT:     Head:     Comments: Laceration to right temple area approximately 4 cm across. Eyes:     Extraocular Movements: Extraocular movements intact.  Cardiovascular:     Rate and Rhythm: Regular rhythm.  Musculoskeletal:        General: No tenderness.     Cervical back: Neck supple. No tenderness.  Skin:    Capillary Refill: Capillary refill takes less than 2 seconds.  Neurological:     Mental Status: He is alert. Mental status is at baseline.     (all labs ordered are listed, but only abnormal results are displayed) Labs Reviewed - No data to display  EKG: None  Radiology: CT Cervical Spine Wo Contrast Result Date: 02/06/2024 CLINICAL DATA:  Status post fall. EXAM: CT CERVICAL SPINE WITHOUT CONTRAST TECHNIQUE: Multidetector CT imaging of the cervical spine was performed without intravenous contrast. Multiplanar CT image reconstructions  were also generated. RADIATION DOSE REDUCTION: This exam was performed according to the departmental dose-optimization program which includes automated exposure control, adjustment of the mA and/or kV according to patient size and/or use of iterative reconstruction technique. COMPARISON:  None Available. FINDINGS: Alignment: Normal. Skull base and vertebrae: No acute fracture. No primary bone lesion or focal pathologic process. Metallic density fusion plate and screws are seen on the anterior aspect of the C5 and C6 vertebral bodies. Soft tissues and spinal canal: No prevertebral fluid or swelling. No visible canal hematoma. Disc levels: Marked severity endplate sclerosis is seen at the levels of C5-C6 and C6-C7 with moderate severity anterior osteophyte formation at C4-C5 and C7-T1. There is marked severity narrowing of the anterior atlantoaxial articulation with marked severity intervertebral disc space narrowing seen at C6-C7. Anterior surgical fusion of the C5-C6 level is seen. Bilateral marked severity multilevel facet joint hypertrophy is noted. Upper chest: Negative. Other: None. IMPRESSION: 1. No acute fracture or subluxation of the cervical spine. 2. Anterior surgical fusion of the C5-C6 level. 3. Marked severity multilevel degenerative changes, as described above. Electronically Signed   By: Suzen Dials M.D.   On: 02/06/2024 21:22   CT Head Wo Contrast Result Date: 02/06/2024 CLINICAL DATA:  Status post fall. EXAM: CT HEAD WITHOUT CONTRAST TECHNIQUE: Contiguous axial images were obtained from the base of the skull through the vertex without intravenous contrast. RADIATION DOSE REDUCTION: This exam was performed according to the departmental dose-optimization program which includes automated exposure control, adjustment of the mA and/or kV according to patient size and/or use of iterative reconstruction technique. COMPARISON:  July 27, 2023 FINDINGS: Brain: There is generalized cerebral atrophy  with widening of the extra-axial spaces and ventricular dilatation. There are areas of decreased attenuation within the white matter tracts of the supratentorial brain, consistent with microvascular disease changes. Small, chronic bilateral basal ganglia lacunar infarcts are noted. Vascular: Marked severity bilateral cavernous carotid artery calcification is noted. Skull: Normal. Negative for fracture or focal lesion. Sinuses/Orbits: No acute finding. Other: None. IMPRESSION: 1. Generalized cerebral atrophy and microvascular disease changes of the supratentorial brain. 2. Small, chronic bilateral basal ganglia lacunar infarcts. 3. No acute intracranial abnormality. Electronically Signed   By: Suzen Dials M.D.   On: 02/06/2024 21:20     Procedures   Medications Ordered in the ED  lidocaine -EPINEPHrine  (XYLOCAINE  W/EPI) 2 %-1:200000 (PF) injection 10 mL (has no administration in time range)  Tdap (BOOSTRIX ) injection 0.5 mL (0.5 mLs Intramuscular  Given 02/06/24 2211)                                    Medical Decision Making Risk Prescription drug management.   Patient presents after mechanical fall.  Unsteady at baseline.  Hit head.  Head CT done reassuring.  Cervical spine with chronic changes.  Will close wound and discharge home.  Will update his tetanus shot.  Differential diagnosis initially included cause such as cervical spine fractures and intracranial hemorrhage.  Wound closed and will discharge home.     Final diagnoses:  Fall, initial encounter  Facial laceration, initial encounter    ED Discharge Orders     None          Patsey Lot, MD 02/06/24 2244

## 2024-02-10 DIAGNOSIS — Z4802 Encounter for removal of sutures: Secondary | ICD-10-CM | POA: Diagnosis not present

## 2024-02-10 DIAGNOSIS — I10 Essential (primary) hypertension: Secondary | ICD-10-CM | POA: Diagnosis not present

## 2024-02-10 DIAGNOSIS — E1165 Type 2 diabetes mellitus with hyperglycemia: Secondary | ICD-10-CM | POA: Diagnosis not present

## 2024-02-10 DIAGNOSIS — Z299 Encounter for prophylactic measures, unspecified: Secondary | ICD-10-CM | POA: Diagnosis not present

## 2024-03-15 DIAGNOSIS — F0390 Unspecified dementia without behavioral disturbance: Secondary | ICD-10-CM | POA: Diagnosis not present

## 2024-03-15 DIAGNOSIS — I10 Essential (primary) hypertension: Secondary | ICD-10-CM | POA: Diagnosis not present

## 2024-03-15 DIAGNOSIS — E1159 Type 2 diabetes mellitus with other circulatory complications: Secondary | ICD-10-CM | POA: Diagnosis not present

## 2024-03-15 DIAGNOSIS — Z299 Encounter for prophylactic measures, unspecified: Secondary | ICD-10-CM | POA: Diagnosis not present

## 2024-03-28 DIAGNOSIS — R4182 Altered mental status, unspecified: Secondary | ICD-10-CM | POA: Diagnosis not present

## 2024-03-28 DIAGNOSIS — G9389 Other specified disorders of brain: Secondary | ICD-10-CM | POA: Diagnosis not present

## 2024-03-28 DIAGNOSIS — G9341 Metabolic encephalopathy: Secondary | ICD-10-CM | POA: Diagnosis not present

## 2024-03-28 DIAGNOSIS — Z792 Long term (current) use of antibiotics: Secondary | ICD-10-CM | POA: Diagnosis not present

## 2024-03-28 DIAGNOSIS — I639 Cerebral infarction, unspecified: Secondary | ICD-10-CM | POA: Diagnosis not present

## 2024-03-28 DIAGNOSIS — N3001 Acute cystitis with hematuria: Secondary | ICD-10-CM | POA: Diagnosis not present

## 2024-03-28 DIAGNOSIS — B952 Enterococcus as the cause of diseases classified elsewhere: Secondary | ICD-10-CM | POA: Diagnosis not present

## 2024-03-28 DIAGNOSIS — E119 Type 2 diabetes mellitus without complications: Secondary | ICD-10-CM | POA: Diagnosis not present

## 2024-03-28 DIAGNOSIS — Z1152 Encounter for screening for COVID-19: Secondary | ICD-10-CM | POA: Diagnosis not present

## 2024-03-28 DIAGNOSIS — Z79899 Other long term (current) drug therapy: Secondary | ICD-10-CM | POA: Diagnosis not present

## 2024-03-28 DIAGNOSIS — F0390 Unspecified dementia without behavioral disturbance: Secondary | ICD-10-CM | POA: Diagnosis not present

## 2024-03-28 DIAGNOSIS — Z7984 Long term (current) use of oral hypoglycemic drugs: Secondary | ICD-10-CM | POA: Diagnosis not present

## 2024-03-28 DIAGNOSIS — F17220 Nicotine dependence, chewing tobacco, uncomplicated: Secondary | ICD-10-CM | POA: Diagnosis not present

## 2024-03-28 DIAGNOSIS — Z794 Long term (current) use of insulin: Secondary | ICD-10-CM | POA: Diagnosis not present

## 2024-03-28 DIAGNOSIS — N390 Urinary tract infection, site not specified: Secondary | ICD-10-CM | POA: Diagnosis not present

## 2024-03-28 DIAGNOSIS — Z7982 Long term (current) use of aspirin: Secondary | ICD-10-CM | POA: Diagnosis not present

## 2024-03-28 DIAGNOSIS — Z20822 Contact with and (suspected) exposure to covid-19: Secondary | ICD-10-CM | POA: Diagnosis not present

## 2024-03-28 DIAGNOSIS — F0280 Dementia in other diseases classified elsewhere without behavioral disturbance: Secondary | ICD-10-CM | POA: Diagnosis not present

## 2024-03-28 DIAGNOSIS — I10 Essential (primary) hypertension: Secondary | ICD-10-CM | POA: Diagnosis not present

## 2024-03-30 DIAGNOSIS — N390 Urinary tract infection, site not specified: Secondary | ICD-10-CM | POA: Diagnosis not present

## 2024-03-30 DIAGNOSIS — E119 Type 2 diabetes mellitus without complications: Secondary | ICD-10-CM | POA: Diagnosis not present

## 2024-03-30 DIAGNOSIS — G9341 Metabolic encephalopathy: Secondary | ICD-10-CM | POA: Diagnosis not present

## 2024-03-30 DIAGNOSIS — I10 Essential (primary) hypertension: Secondary | ICD-10-CM | POA: Diagnosis not present

## 2024-03-30 DIAGNOSIS — Z79899 Other long term (current) drug therapy: Secondary | ICD-10-CM | POA: Diagnosis not present

## 2024-03-31 DIAGNOSIS — Z792 Long term (current) use of antibiotics: Secondary | ICD-10-CM | POA: Diagnosis not present

## 2024-03-31 DIAGNOSIS — Z79899 Other long term (current) drug therapy: Secondary | ICD-10-CM | POA: Diagnosis not present

## 2024-03-31 DIAGNOSIS — N390 Urinary tract infection, site not specified: Secondary | ICD-10-CM | POA: Diagnosis not present

## 2024-03-31 DIAGNOSIS — G9341 Metabolic encephalopathy: Secondary | ICD-10-CM | POA: Diagnosis not present

## 2024-03-31 DIAGNOSIS — I10 Essential (primary) hypertension: Secondary | ICD-10-CM | POA: Diagnosis not present

## 2024-03-31 DIAGNOSIS — E119 Type 2 diabetes mellitus without complications: Secondary | ICD-10-CM | POA: Diagnosis not present

## 2024-04-01 DIAGNOSIS — G9341 Metabolic encephalopathy: Secondary | ICD-10-CM | POA: Diagnosis not present

## 2024-04-01 DIAGNOSIS — N390 Urinary tract infection, site not specified: Secondary | ICD-10-CM | POA: Diagnosis not present

## 2024-04-01 DIAGNOSIS — Z79899 Other long term (current) drug therapy: Secondary | ICD-10-CM | POA: Diagnosis not present

## 2024-04-01 DIAGNOSIS — E119 Type 2 diabetes mellitus without complications: Secondary | ICD-10-CM | POA: Diagnosis not present

## 2024-04-01 DIAGNOSIS — I10 Essential (primary) hypertension: Secondary | ICD-10-CM | POA: Diagnosis not present

## 2024-04-05 DIAGNOSIS — N390 Urinary tract infection, site not specified: Secondary | ICD-10-CM | POA: Diagnosis not present

## 2024-04-18 DIAGNOSIS — F03B4 Unspecified dementia, moderate, with anxiety: Secondary | ICD-10-CM | POA: Diagnosis not present

## 2024-04-18 DIAGNOSIS — Z515 Encounter for palliative care: Secondary | ICD-10-CM | POA: Diagnosis not present

## 2024-04-19 DIAGNOSIS — F03918 Unspecified dementia, unspecified severity, with other behavioral disturbance: Secondary | ICD-10-CM | POA: Diagnosis not present

## 2024-04-26 DIAGNOSIS — F03918 Unspecified dementia, unspecified severity, with other behavioral disturbance: Secondary | ICD-10-CM | POA: Diagnosis not present

## 2024-05-08 DIAGNOSIS — F02C11 Dementia in other diseases classified elsewhere, severe, with agitation: Secondary | ICD-10-CM | POA: Diagnosis not present

## 2024-05-11 DIAGNOSIS — R296 Repeated falls: Secondary | ICD-10-CM | POA: Diagnosis not present

## 2024-05-11 DIAGNOSIS — F419 Anxiety disorder, unspecified: Secondary | ICD-10-CM | POA: Diagnosis not present

## 2024-05-11 DIAGNOSIS — Z87440 Personal history of urinary (tract) infections: Secondary | ICD-10-CM | POA: Diagnosis not present

## 2024-05-11 DIAGNOSIS — M6281 Muscle weakness (generalized): Secondary | ICD-10-CM | POA: Diagnosis not present

## 2024-05-11 DIAGNOSIS — E119 Type 2 diabetes mellitus without complications: Secondary | ICD-10-CM | POA: Diagnosis not present

## 2024-05-11 DIAGNOSIS — Z515 Encounter for palliative care: Secondary | ICD-10-CM | POA: Diagnosis not present

## 2024-05-11 DIAGNOSIS — F03B4 Unspecified dementia, moderate, with anxiety: Secondary | ICD-10-CM | POA: Diagnosis not present

## 2024-05-11 DIAGNOSIS — Z7984 Long term (current) use of oral hypoglycemic drugs: Secondary | ICD-10-CM | POA: Diagnosis not present

## 2024-05-15 DIAGNOSIS — F03911 Unspecified dementia, unspecified severity, with agitation: Secondary | ICD-10-CM | POA: Diagnosis not present

## 2024-05-19 DIAGNOSIS — I10 Essential (primary) hypertension: Secondary | ICD-10-CM | POA: Diagnosis not present

## 2024-05-19 DIAGNOSIS — R451 Restlessness and agitation: Secondary | ICD-10-CM | POA: Diagnosis not present

## 2024-05-19 DIAGNOSIS — G311 Senile degeneration of brain, not elsewhere classified: Secondary | ICD-10-CM | POA: Diagnosis not present

## 2024-05-19 DIAGNOSIS — F419 Anxiety disorder, unspecified: Secondary | ICD-10-CM | POA: Diagnosis not present

## 2024-05-19 DIAGNOSIS — G4700 Insomnia, unspecified: Secondary | ICD-10-CM | POA: Diagnosis not present

## 2024-05-19 DIAGNOSIS — E109 Type 1 diabetes mellitus without complications: Secondary | ICD-10-CM | POA: Diagnosis not present

## 2024-05-19 DIAGNOSIS — Z9181 History of falling: Secondary | ICD-10-CM | POA: Diagnosis not present

## 2024-05-19 DIAGNOSIS — M6281 Muscle weakness (generalized): Secondary | ICD-10-CM | POA: Diagnosis not present

## 2024-05-19 DIAGNOSIS — R2681 Unsteadiness on feet: Secondary | ICD-10-CM | POA: Diagnosis not present

## 2024-05-19 NOTE — Discharge Summary (Signed)
 Physician Discharge Summary  Admit date: 04/04/2024  Discharge date: 05/19/2024   Discharge to: Hospice home  Discharge Service: General Medicine (MED)  Discharge Attending Physician: Leta KATHEE Fear, MD  Discharge Diagnoses:  Severe Dementia with agitation Principal Problem:   Metabolic encephalopathy Active Problems:   Hypertension   Diabetes mellitus (CMS-HCC)   Unsteady gait   Acute cystitis with hematuria   Agitation due to dementia (CMS-HCC)   Urge incontinence of urine   Insomnia, unspecified   Cognitive communication deficit   Problems with swallowing and mastication   Repeated falls   Muscle weakness (generalized)   Other abnormalities of gait and mobility   Unsteadiness on feet   Heartburn     Procedures:  Consults:      Pertinent Test Results:  All lab results last 24 hours:   Recent Results (from the past 24 hours)  POCT Glucose   Collection Time: 05/18/24  4:11 PM  Result Value Ref Range   Glucose, POC 170 (H) 70 - 105 mg/dL  POCT Glucose   Collection Time: 05/19/24  7:29 AM  Result Value Ref Range   Glucose, POC 138 (H) 70 - 105 mg/dL    Nursing home course:  Patient was admitted to the nursing home for home [facility]. Patient was noted to have metabolic encephalopathy. Also has severe dementia with cognitive problems. He is agitated at times. His dementia has gotten progressively worse.  He is more agitated and combative at times. Risperdal has been given Xanax could be added as needed and increase the dose. Patient has a history of diabetes hypertension. Many of his medication has been discontinued now as he has dementia with progressive worsening. Psychiatry consultation if needed for adjustment of the medication for aggression   Condition at Discharge: stable Discharge Medications:    Your Medication List     STOP taking these medications    aspirin  81 MG chewable tablet   glimepiride  2 MG tablet Commonly known as: AMARYL     metFORMIN 500 MG (OSM) 24 hr tablet Commonly known as: FORTAMET   nitrofurantoin (macrocrystal-monohydrate) 100 MG capsule Commonly known as: MACROBID   rosuvastatin 5 MG tablet Commonly known as: CRESTOR   solifenacin 10 MG tablet Commonly known as: VESICARE       START taking these medications    oxybutynin  10 MG 24 hr tablet Commonly known as: DITROPAN -XL Take 1 tablet (10 mg total) by mouth nightly.       CHANGE how you take these medications    melatonin 3 mg Tab Take 1 tablet (3 mg total) by mouth nightly. What changed: when to take this   risperiDONE 1 MG tablet Commonly known as: RisperDAL Take 1 tablet (1 mg total) by mouth two (2) times a day. What changed: when to take this   valsartan 40 MG tablet Commonly known as: DIOVAN Take 1 tablet (40 mg total) by mouth in the morning. What changed: when to take this        Labs: Blood No results for input(s): WBC, HGB, HCT, PLT in the last 168 hours. No results for input(s): NA, K, CL, CO2, BUN, CREATININE, GLU, CALCIUM, ALBUMIN, PROT, BILITOT, AST, ALT, ALKPHOS, MG, PHOS, LIPASE, LACTATE, AMMONIA, CAION in the last 168 hours. No results for input(s): CKTOTAL, TROPONINI, TROPONINT, CKMB, EDTPNI, PROBNP, CHOL, LDL, HDL, TRIG in the last 168 hours. No results for input(s): INR, LABPROT, APTT, DDIMER in the last 168 hours. No results for input(s): TSH, ETOH, ACETAMIN, SALICYLATE, FREET3,  T4FREE, A1C, ACETONE, TTR, ESR, CRP, HSCRP, ANA, RF, VITAMINB12, FOLATE, IRON, LABIRON, TIBC, FERRITIN, RETIC, RETICCTPCT, C3, C4, LDH, HAPTM, URICACID, HEPP4, CEA, RAPSCRN, CDIFRPCR, CDIFFNAP1, HIV12SCRN, HIVCP, TBCELLQN in the last 168 hours.  No results for input(s): HEPAIGM, HEPBSAG, HEPBIGM, HEPCAB, MITOAB in the last 168 hours. Urine No results for input(s): WBCUA,  NITRITE, LEUKOCYTESUR, BACTERIA, RBCUA, BLOODU, GLUCOSEU, PROTEINUA, KETONESU in the last 168 hours. No results for input(s): KETUR, PREGTESTUR, PREGPOC, NAURINE, LABOSMO, OSMOFT, PROTEINUR, CREATUR, PCRATIOUR, OPIAU, BENZU, TRICYCLIC, PCPU, AMPHU, COCAU, CANNAU, BARBU, URPROTELEC in the last 168 hours. Body Fluids No results for input(s): FTYP1, WBCFLUID, FNEUT, LYMPHSFL, FMONO, EOSFL, RBCFL, CLARITYFLUID, COLORFL in the last 168 hours. ABG No results for input(s): O2SOUR, FIO2ART, PHART, PCO2ART, PO2ART, HCO3ART, O2SATART, BEART in the last 72 hours. Microbiology Results (last day)     ** No results found for the last 24 hours. **        Radiology: No results found.  Discharge Instructions:  Activity Instructions   Ambulate with assist     Diet Instructions   Heart Healthy Consistent Carbohydrate     Other Instructions   Accuchecks twice a day to monitor sugar. Skin prep diabetic ulcer to left lower leg twice a day for protection.     Appointments which have been scheduled for you    May 19, 2024 12:00 PM PT TREATMENT 45 with LTC PT PROVIDER 1- EDEN PHYSICAL THERAPY LTC EDEN [facility] [place] [address] [place] [address] [phone]     May 21, 2024 6:45 AM OT TREATMENT 45 with Suzen Kerns, OTA OCCUPATIONAL THERAPY LTC EDEN [facility] [place] [address] [place] [address] [phone]     May 21, 2024 12:00 PM PT TREATMENT 45 with LTC PT PROVIDER 1- EDEN PHYSICAL THERAPY LTC EDEN [facility] [place] [address] [place] [address] [phone]     May 22, 2024 6:45 AM OT TREATMENT 45 with Suzen Kerns, OTA OCCUPATIONAL THERAPY LTC EDEN [facility] [place] [address] [place] [address] [phone]     May 22, 2024 12:00 PM PT TREATMENT 45 with LTC PT PROVIDER 1-  EDEN PHYSICAL THERAPY LTC EDEN [facility] [place] [address] [place] [address] [phone]     May 23, 2024 6:45 AM OT TREATMENT 45 with Suzen Kerns, OTA OCCUPATIONAL THERAPY LTC EDEN [facility] [place] [address] [place] [address] [phone]     May 23, 2024 12:00 PM PT TREATMENT 45 with LTC PT PROVIDER 1- EDEN PHYSICAL THERAPY LTC EDEN [facility] [place] [address] [place] [address] [phone]     May 24, 2024 6:45 AM OT TREATMENT 45 with Suzen Kerns, OTA OCCUPATIONAL THERAPY LTC EDEN [facility] [place] [address] [place] [address] [phone]     May 24, 2024 12:00 PM PT TREATMENT 45 with LTC PT PROVIDER 1- EDEN PHYSICAL THERAPY LTC EDEN [facility] [place] [address] [place] [address] [phone]     May 25, 2024 6:45 AM OT TREATMENT 45 with Suzen Kerns, OTA OCCUPATIONAL THERAPY LTC EDEN [facility] [place] [address] [place] [address] [phone]     May 25, 2024 12:00 PM PT TREATMENT 45 with LTC PT PROVIDER 1- EDEN PHYSICAL THERAPY LTC EDEN [facility] [place] [address] [place] [address] [phone]     May 26, 2024 12:00 PM PT TREATMENT 45 with LTC PT PROVIDER 1- EDEN PHYSICAL THERAPY LTC  EDEN [facility] [place] [address] [place] [address] [phone]     May 28, 2024 6:45 AM OT TREATMENT 45 with Suzen Kerns, OTA OCCUPATIONAL THERAPY LTC EDEN [facility] [place] [address] [place] [address] [phone]     May 28, 2024 12:00 PM PT TREATMENT 45 with LTC PT PROVIDER 1- EDEN PHYSICAL THERAPY LTC EDEN [facility] [place] [address] [place] [address] [phone]     May 29, 2024 6:45 AM OT TREATMENT 45 with Suzen Kerns, OTA OCCUPATIONAL THERAPY LTC EDEN [facility]  [place] [address] [place] [address] [phone]     May 29, 2024 12:00 PM PT TREATMENT 45 with LTC PT PROVIDER 1- EDEN PHYSICAL THERAPY LTC EDEN [facility] [place] [address] [place] [address] [phone]     May 30, 2024 6:45 AM OT TREATMENT 45 with Suzen Kerns, OTA OCCUPATIONAL THERAPY LTC EDEN [facility] [place] [address] Larence Hensen [place] [address] [phone]     May 30, 2024 12:00 PM PT TREATMENT 45 with LTC PT PROVIDER 1- EDEN PHYSICAL THERAPY LTC EDEN [facility] [place] [address] [place] [address] [phone]     May 31, 2024 6:45 AM OT TREATMENT 45 with Suzen Kerns, OTA OCCUPATIONAL THERAPY LTC EDEN [facility] [place] [address] [place]rence [place] [address] [phone]     May 31, 2024 12:00 PM PT TREATMENT 45 with LTC PT PROVIDER 1- EDEN PHYSICAL THERAPY LTC EDEN [facility] [place] [address] [place] [address] [phone]     Jun 01, 2024 6:45 AM OT TREATMENT 45 with Suzen Kerns, OTA OCCUPATIONAL THERAPY LTC EDEN [facility] [place] [address] Larence Hensen [place] [address] [phone]     Jun 01, 2024 12:00 PM PT TREATMENT 45 with LTC PT PROVIDER 1- EDEN PHYSICAL THERAPY LTC EDEN [facility] [place] [address] [place] [address] [phone]     Jun 02, 2024 12:00 PM PT TREATMENT 45 with LTC PT PROVIDER 1- EDEN PHYSICAL THERAPY LTC EDEN [facility] [place] [address] [place] [address] [phone]     Jun 04, 2024 6:45 AM OT TREATMENT 45 with Suzen Kerns, OTA OCCUPATIONAL THERAPY LTC EDEN [facility] [place] [address] Larence Hensen [place] [address] [phone]     Jun 04, 2024 12:00 PM PT TREATMENT 45 with LTC PT PROVIDER 1- EDEN PHYSICAL THERAPY LTC EDEN [facility] [place] [address] [place]  [address] [phone]     Jun 05, 2024 6:45 AM OT TREATMENT 45 with Suzen Kerns, OTA OCCUPATIONAL THERAPY LTC EDEN [facility] [place] [address] Larence Hensen [place] [address] [phone]     Jun 05, 2024 12:00 PM PT TREATMENT 45 with LTC PT PROVIDER 1- EDEN PHYSICAL THERAPY LTC EDEN [facility] [place] [address] [place] [address] [phone]     Jun 06, 2024 6:45 AM OT TREATMENT 45 with Suzen Kerns, OTA OCCUPATIONAL THERAPY LTC EDEN [facility] [place] [address] Larence Hensen [place] [address] [phone]     Jun 06, 2024 12:00 PM PT TREATMENT 45 with LTC PT PROVIDER 1- EDEN PHYSICAL THERAPY LTC EDEN [facility] [place] [address] [place] [address] [phone]     Jun 07, 2024 6:45 AM OT TREATMENT 45 with Suzen Kerns, OTA OCCUPATIONAL THERAPY LTC EDEN [facility] [place] [address] [place] [address] [phone]     Jun 07, 2024 12:00 PM PT  TREATMENT 45 with LTC PT PROVIDER 1- EDEN PHYSICAL THERAPY LTC EDEN [facility] [place] [address] [place] [address] [phone]     Jun 08, 2024 6:45 AM OT TREATMENT 45 with Suzen Kerns, OTA OCCUPATIONAL THERAPY LTC EDEN [facility] [place] [address] [place] [address] [phone]     Jun 08, 2024 12:00 PM PT TREATMENT 45 with LTC PT PROVIDER 1- EDEN PHYSICAL THERAPY LTC EDEN [facility] [place] [address] [place] [address] [phone]     Jun 09, 2024 12:00 PM PT TREATMENT 45 with LTC PT PROVIDER 1- EDEN PHYSICAL THERAPY LTC EDEN [facility] [place] [address] [place] [address] [phone]     Jun 11, 2024 6:45 AM OT TREATMENT 45 with Suzen Kerns, OTA OCCUPATIONAL THERAPY LTC EDEN [facility] [place] [address] [place] [address] [phone]     Jun 11, 2024 12:00  PM PT TREATMENT 45 with LTC PT PROVIDER 1- EDEN PHYSICAL THERAPY LTC EDEN [facility] [place] [address] [place] [address] [phone]     Jun 12, 2024 6:45 AM OT TREATMENT 45 with Suzen Kerns, OTA OCCUPATIONAL THERAPY LTC EDEN [facility] [place] [address] [place] [address] [phone]     Jun 12, 2024 12:00 PM PT TREATMENT 45 with LTC PT PROVIDER 1- EDEN PHYSICAL THERAPY LTC EDEN [facility] [place] [address] [place] [address] [phone]     Jun 13, 2024 6:45 AM OT TREATMENT 45 with Suzen Kerns, OTA OCCUPATIONAL THERAPY LTC EDEN [facility] [place] [address] Larence Hensen [place] [address] [phone]     Jun 13, 2024 12:00 PM PT TREATMENT 45 with LTC PT PROVIDER 1- EDEN PHYSICAL THERAPY LTC EDEN [facility] [place] [address] [place] [address] [phone]     Jun 14, 2024 6:45 AM OT TREATMENT 45 with Suzen Kerns, OTA OCCUPATIONAL THERAPY LTC EDEN [facility] [place] [address] Larence Hensen [place] [address] [phone]     Jun 14, 2024 12:00 PM PT TREATMENT 45 with LTC PT PROVIDER 1- EDEN PHYSICAL THERAPY LTC EDEN [facility] [place] [address] [place] [address] [phone]     Jun 15, 2024 6:45 AM OT TREATMENT 45 with Suzen Kerns, OTA OCCUPATIONAL THERAPY LTC EDEN [facility] [place] [address] [place] [address] [phone]     Jun 15, 2024 12:00 PM PT TREATMENT 45 with LTC PT PROVIDER 1- EDEN PHYSICAL THERAPY LTC EDEN [facility] [place] [address] [place] [address] [phone]          Leta KATHEE Fear, MD

## 2024-05-20 DIAGNOSIS — R451 Restlessness and agitation: Secondary | ICD-10-CM | POA: Diagnosis not present

## 2024-05-20 DIAGNOSIS — G311 Senile degeneration of brain, not elsewhere classified: Secondary | ICD-10-CM | POA: Diagnosis not present

## 2024-05-20 DIAGNOSIS — F419 Anxiety disorder, unspecified: Secondary | ICD-10-CM | POA: Diagnosis not present

## 2024-05-20 DIAGNOSIS — G4700 Insomnia, unspecified: Secondary | ICD-10-CM | POA: Diagnosis not present

## 2024-05-20 DIAGNOSIS — I10 Essential (primary) hypertension: Secondary | ICD-10-CM | POA: Diagnosis not present

## 2024-05-20 DIAGNOSIS — E109 Type 1 diabetes mellitus without complications: Secondary | ICD-10-CM | POA: Diagnosis not present

## 2024-05-21 DIAGNOSIS — G4700 Insomnia, unspecified: Secondary | ICD-10-CM | POA: Diagnosis not present

## 2024-05-21 DIAGNOSIS — R451 Restlessness and agitation: Secondary | ICD-10-CM | POA: Diagnosis not present

## 2024-05-21 DIAGNOSIS — G311 Senile degeneration of brain, not elsewhere classified: Secondary | ICD-10-CM | POA: Diagnosis not present

## 2024-05-21 DIAGNOSIS — F419 Anxiety disorder, unspecified: Secondary | ICD-10-CM | POA: Diagnosis not present

## 2024-05-21 DIAGNOSIS — E109 Type 1 diabetes mellitus without complications: Secondary | ICD-10-CM | POA: Diagnosis not present

## 2024-05-21 DIAGNOSIS — I10 Essential (primary) hypertension: Secondary | ICD-10-CM | POA: Diagnosis not present

## 2024-05-23 ENCOUNTER — Emergency Department (HOSPITAL_COMMUNITY)

## 2024-05-23 ENCOUNTER — Inpatient Hospital Stay (HOSPITAL_COMMUNITY)
Admission: EM | Admit: 2024-05-23 | Discharge: 2024-05-25 | DRG: 689 | Disposition: A | Discharge status: Hospice | Source: Hospice | Attending: Family Medicine | Admitting: Family Medicine

## 2024-05-23 ENCOUNTER — Encounter (HOSPITAL_COMMUNITY): Payer: Self-pay | Admitting: Internal Medicine

## 2024-05-23 DIAGNOSIS — F17220 Nicotine dependence, chewing tobacco, uncomplicated: Secondary | ICD-10-CM | POA: Diagnosis present

## 2024-05-23 DIAGNOSIS — Z974 Presence of external hearing-aid: Secondary | ICD-10-CM

## 2024-05-23 DIAGNOSIS — E109 Type 1 diabetes mellitus without complications: Secondary | ICD-10-CM | POA: Diagnosis not present

## 2024-05-23 DIAGNOSIS — N1831 Chronic kidney disease, stage 3a: Secondary | ICD-10-CM | POA: Diagnosis present

## 2024-05-23 DIAGNOSIS — N390 Urinary tract infection, site not specified: Secondary | ICD-10-CM | POA: Diagnosis present

## 2024-05-23 DIAGNOSIS — I10 Essential (primary) hypertension: Secondary | ICD-10-CM | POA: Diagnosis not present

## 2024-05-23 DIAGNOSIS — R55 Syncope and collapse: Secondary | ICD-10-CM | POA: Diagnosis not present

## 2024-05-23 DIAGNOSIS — E1122 Type 2 diabetes mellitus with diabetic chronic kidney disease: Secondary | ICD-10-CM | POA: Diagnosis present

## 2024-05-23 DIAGNOSIS — Z885 Allergy status to narcotic agent status: Secondary | ICD-10-CM | POA: Diagnosis not present

## 2024-05-23 DIAGNOSIS — Z7982 Long term (current) use of aspirin: Secondary | ICD-10-CM

## 2024-05-23 DIAGNOSIS — Z515 Encounter for palliative care: Secondary | ICD-10-CM | POA: Diagnosis not present

## 2024-05-23 DIAGNOSIS — Z66 Do not resuscitate: Secondary | ICD-10-CM | POA: Diagnosis present

## 2024-05-23 DIAGNOSIS — G9341 Metabolic encephalopathy: Secondary | ICD-10-CM | POA: Diagnosis present

## 2024-05-23 DIAGNOSIS — Z9049 Acquired absence of other specified parts of digestive tract: Secondary | ICD-10-CM | POA: Diagnosis not present

## 2024-05-23 DIAGNOSIS — E1165 Type 2 diabetes mellitus with hyperglycemia: Secondary | ICD-10-CM | POA: Diagnosis present

## 2024-05-23 DIAGNOSIS — Z7984 Long term (current) use of oral hypoglycemic drugs: Secondary | ICD-10-CM | POA: Diagnosis not present

## 2024-05-23 DIAGNOSIS — E785 Hyperlipidemia, unspecified: Secondary | ICD-10-CM | POA: Diagnosis present

## 2024-05-23 DIAGNOSIS — Z800 Family history of malignant neoplasm of digestive organs: Secondary | ICD-10-CM | POA: Diagnosis not present

## 2024-05-23 DIAGNOSIS — G311 Senile degeneration of brain, not elsewhere classified: Secondary | ICD-10-CM | POA: Diagnosis not present

## 2024-05-23 DIAGNOSIS — R296 Repeated falls: Secondary | ICD-10-CM | POA: Diagnosis present

## 2024-05-23 DIAGNOSIS — I129 Hypertensive chronic kidney disease with stage 1 through stage 4 chronic kidney disease, or unspecified chronic kidney disease: Secondary | ICD-10-CM | POA: Diagnosis present

## 2024-05-23 DIAGNOSIS — Z8659 Personal history of other mental and behavioral disorders: Secondary | ICD-10-CM

## 2024-05-23 DIAGNOSIS — F03911 Unspecified dementia, unspecified severity, with agitation: Secondary | ICD-10-CM | POA: Diagnosis present

## 2024-05-23 DIAGNOSIS — Z96612 Presence of left artificial shoulder joint: Secondary | ICD-10-CM | POA: Diagnosis present

## 2024-05-23 DIAGNOSIS — R9082 White matter disease, unspecified: Secondary | ICD-10-CM | POA: Diagnosis not present

## 2024-05-23 DIAGNOSIS — F419 Anxiety disorder, unspecified: Secondary | ICD-10-CM | POA: Diagnosis not present

## 2024-05-23 DIAGNOSIS — R410 Disorientation, unspecified: Secondary | ICD-10-CM

## 2024-05-23 DIAGNOSIS — R404 Transient alteration of awareness: Secondary | ICD-10-CM | POA: Diagnosis not present

## 2024-05-23 DIAGNOSIS — R4182 Altered mental status, unspecified: Principal | ICD-10-CM | POA: Diagnosis present

## 2024-05-23 DIAGNOSIS — Z981 Arthrodesis status: Secondary | ICD-10-CM | POA: Diagnosis not present

## 2024-05-23 DIAGNOSIS — G4700 Insomnia, unspecified: Secondary | ICD-10-CM | POA: Diagnosis not present

## 2024-05-23 DIAGNOSIS — F03C11 Unspecified dementia, severe, with agitation: Secondary | ICD-10-CM | POA: Diagnosis not present

## 2024-05-23 DIAGNOSIS — K219 Gastro-esophageal reflux disease without esophagitis: Secondary | ICD-10-CM | POA: Diagnosis present

## 2024-05-23 DIAGNOSIS — R451 Restlessness and agitation: Secondary | ICD-10-CM | POA: Diagnosis not present

## 2024-05-23 DIAGNOSIS — Z79899 Other long term (current) drug therapy: Secondary | ICD-10-CM | POA: Diagnosis not present

## 2024-05-23 DIAGNOSIS — R4020 Unspecified coma: Secondary | ICD-10-CM

## 2024-05-23 LAB — URINALYSIS, ROUTINE W REFLEX MICROSCOPIC
Bilirubin Urine: NEGATIVE
Glucose, UA: NEGATIVE mg/dL
Ketones, ur: 5 mg/dL — AB
Nitrite: NEGATIVE
Protein, ur: 100 mg/dL — AB
Specific Gravity, Urine: 1.019 (ref 1.005–1.030)
WBC, UA: >50 WBC/hpf (ref 0–5)
pH: 5 (ref 5.0–8.0)

## 2024-05-23 LAB — COMPREHENSIVE METABOLIC PANEL WITH GFR
ALT: 16 U/L (ref 0–44)
AST: 22 U/L (ref 15–41)
Albumin: 4.4 g/dL (ref 3.5–5.0)
Alkaline Phosphatase: 86 U/L (ref 38–126)
Anion gap: 14 (ref 5–15)
BUN: 30 mg/dL — ABNORMAL HIGH (ref 8–23)
CO2: 23 mmol/L (ref 22–32)
Calcium: 10.2 mg/dL (ref 8.9–10.3)
Chloride: 104 mmol/L (ref 98–111)
Creatinine, Ser: 1.27 mg/dL — ABNORMAL HIGH (ref 0.61–1.24)
GFR, Estimated: 57 mL/min — ABNORMAL LOW (ref >60)
Glucose, Bld: 155 mg/dL — ABNORMAL HIGH (ref 70–99)
Potassium: 4.9 mmol/L (ref 3.5–5.1)
Sodium: 142 mmol/L (ref 135–145)
Total Bilirubin: 0.5 mg/dL (ref 0.0–1.2)
Total Protein: 7.6 g/dL (ref 6.5–8.1)

## 2024-05-23 LAB — URINE DRUG SCREEN
Amphetamines: NEGATIVE
Barbiturates: NEGATIVE
Benzodiazepines: POSITIVE — AB
Cocaine: NEGATIVE
Fentanyl: NEGATIVE
Methadone Scn, Ur: NEGATIVE
Opiates: NEGATIVE
Tetrahydrocannabinol: NEGATIVE

## 2024-05-23 LAB — CBC WITH DIFFERENTIAL/PLATELET
Abs Immature Granulocytes: 0.04 K/uL (ref 0.00–0.07)
Basophils Absolute: 0.1 K/uL (ref 0.0–0.1)
Basophils Relative: 1 %
Eosinophils Absolute: 0.2 K/uL (ref 0.0–0.5)
Eosinophils Relative: 1 %
HCT: 39.9 % (ref 39.0–52.0)
Hemoglobin: 13 g/dL (ref 13.0–17.0)
Immature Granulocytes: 0 %
Lymphocytes Relative: 13 %
Lymphs Abs: 1.6 K/uL (ref 0.7–4.0)
MCH: 30 pg (ref 26.0–34.0)
MCHC: 32.6 g/dL (ref 30.0–36.0)
MCV: 91.9 fL (ref 80.0–100.0)
Monocytes Absolute: 0.8 K/uL (ref 0.1–1.0)
Monocytes Relative: 7 %
Neutro Abs: 9.4 K/uL — ABNORMAL HIGH (ref 1.7–7.7)
Neutrophils Relative %: 78 %
Platelets: 229 K/uL (ref 150–400)
RBC: 4.34 MIL/uL (ref 4.22–5.81)
RDW: 13.2 % (ref 11.5–15.5)
WBC: 12.1 K/uL — ABNORMAL HIGH (ref 4.0–10.5)
nRBC: 0 % (ref 0.0–0.2)

## 2024-05-23 LAB — AMMONIA: Ammonia: <13 umol/L (ref 9–35)

## 2024-05-23 LAB — CBG MONITORING, ED: Glucose-Capillary: 159 mg/dL — ABNORMAL HIGH (ref 70–99)

## 2024-05-23 LAB — ETHANOL: Alcohol, Ethyl (B): <15 mg/dL (ref <15)

## 2024-05-23 MED ORDER — ACETAMINOPHEN 500 MG PO TABS: 500.0000 mg | ORAL_TABLET | Freq: Four times a day (QID) | ORAL | Status: DC | PRN

## 2024-05-23 MED ORDER — LACTATED RINGERS IV SOLN: INTRAVENOUS | Status: AC

## 2024-05-23 MED ORDER — POLYETHYLENE GLYCOL 3350 17 G PO PACK: 17.0000 g | PACK | Freq: Every day | ORAL | Status: DC | PRN

## 2024-05-23 MED ORDER — ENOXAPARIN SODIUM 40 MG/0.4ML IJ SOSY
40.0000 mg | PREFILLED_SYRINGE | INTRAMUSCULAR | Status: DC
  Administered 2024-05-24 – 2024-05-25 (×2): 40 mg via SUBCUTANEOUS
  Filled 2024-05-23 (×2): qty 0.4

## 2024-05-23 MED ORDER — QUETIAPINE FUMARATE 25 MG PO TABS
12.5000 mg | ORAL_TABLET | Freq: Every day | ORAL | Status: DC
  Filled 2024-05-23: qty 1

## 2024-05-23 MED ORDER — MELATONIN 5 MG PO TABS
5.0000 mg | ORAL_TABLET | Freq: Every evening | ORAL | Status: DC | PRN
Start: 2024-05-23 — End: 2024-05-25

## 2024-05-23 MED ORDER — PROCHLORPERAZINE EDISYLATE 10 MG/2ML IJ SOLN: 5.0000 mg | Freq: Four times a day (QID) | INTRAMUSCULAR | Status: DC | PRN

## 2024-05-23 MED ORDER — QUETIAPINE FUMARATE 25 MG PO TABS: 25.0000 mg | ORAL_TABLET | Freq: Every day | ORAL | Status: DC

## 2024-05-23 MED ORDER — INSULIN GLARGINE-YFGN 100 UNIT/ML ~~LOC~~ SOLN
3.0000 [IU] | Freq: Every day | SUBCUTANEOUS | Status: DC
  Administered 2024-05-24: 3 [IU] via SUBCUTANEOUS
  Filled 2024-05-23 (×3): qty 0.03

## 2024-05-23 MED ORDER — INSULIN ASPART 100 UNIT/ML IJ SOLN
0.0000 [IU] | Freq: Three times a day (TID) | INTRAMUSCULAR | Status: DC
  Administered 2024-05-24: 2 [IU] via SUBCUTANEOUS
  Administered 2024-05-24: 1 [IU] via SUBCUTANEOUS
  Filled 2024-05-23: qty 1

## 2024-05-23 MED ORDER — INSULIN ASPART 100 UNIT/ML IJ SOLN: 0.0000 [IU] | Freq: Every day | INTRAMUSCULAR | Status: DC

## 2024-05-23 MED ORDER — SODIUM CHLORIDE 0.9 % IV SOLN
1.0000 g | Freq: Once | INTRAVENOUS | Status: AC
  Administered 2024-05-23: 1 g via INTRAVENOUS
  Filled 2024-05-23: qty 10

## 2024-05-23 MED ORDER — SODIUM CHLORIDE 0.9 % IV SOLN
2.0000 g | INTRAVENOUS | Status: DC
  Administered 2024-05-24: 2 g via INTRAVENOUS
  Filled 2024-05-23: qty 20

## 2024-05-23 MED ORDER — OLANZAPINE 10 MG IM SOLR
2.5000 mg | Freq: Once | INTRAMUSCULAR | Status: AC
  Administered 2024-05-23: 2.5 mg via INTRAMUSCULAR
  Filled 2024-05-23: qty 10

## 2024-05-23 NOTE — ED Provider Notes (Signed)
 [place] EMERGENCY DEPARTMENT AT [facility] Provider Note   CSN: [id number] Arrival date & time: 05/23/24  1603     Patient presents with: Altered Mental Status   Julian Dominguez is a 83 y.o. male.  {Add pertinent medical, surgical, social history, OB history to HPI:32947} 83 yo M with hx of dementia who presented with altered mental status. Hx obtained per facility staff, daugther, and wfie. For past 3 days has been more combative. Was admitted on Saturday to their facility. Woke up from a nap and looked like he was vagaling and had agonal respiration. O2 sats dropped to 85%. Got 2 rescue breaths. Came back to baseline shortly. CBG was 85. Was unconscious for approx 5 mins. Did have 2 jerks then went unconscious. Is full code. Currently palliative but not hospice.        Prior to Admission medications   Medication Sig Start Date End Date Taking? Authorizing Provider  Ascorbic Acid (VITAMIN C) 1000 MG tablet Take 1,000 mg by mouth daily.      [provider]  aspirin  81 MG chewable tablet Chew 81 mg by mouth daily.    [provider]  diclofenac Sodium (VOLTAREN) 1 % GEL Apply topically as needed.    [provider]  ferrous sulfate 325 (65 FE) MG EC tablet Take 325 mg by mouth daily with breakfast.    [provider]  Garlic 1000 MG CAPS Take 2,000 mg by mouth daily.     [provider]  glimepiride  (AMARYL ) 2 MG tablet Take 2 mg by mouth 2 (two) times daily.    [provider]  glucosamine-chondroitin 500-400 MG tablet Take 2 tablets by mouth daily.    [provider]  ibuprofen (ADVIL) 200 MG tablet Take 400 mg by mouth every 6 (six) hours as needed for moderate pain.    [provider]  melatonin 3 MG TABS tablet Take 3 mg by mouth at bedtime.    [provider]  metFORMIN (GLUCOPHAGE) 1000 MG tablet Take 500 mg by mouth 2 (two) times daily with a meal. 05/20/24   [provider]  mirabegron ER (MYRBETRIQ) 25 MG TB24 tablet Take 25 mg by mouth daily.    [provider]  Multiple Vitamin (MULTIVITAMIN) tablet Take 1 tablet by mouth daily.      [provider]  naproxen sodium (ALEVE) 220 MG tablet Take 220 mg by mouth.    [provider]  Omega-3 Fatty Acids (FISH OIL) 1000 MG CPDR Take 1,200 mg by mouth daily after breakfast. 2 pills in am    [provider]  oxybutynin  (DITROPAN  XL) 10 MG 24 hr tablet Take 1 tablet (10 mg total) by mouth at bedtime. Patient not taking: Reported on 04/12/2023 08/19/22   Onita Duos, MD  risperiDONE (RISPERDAL) 1 MG tablet Take 1 mg by mouth 2 (two) times daily.    [provider]  rosuvastatin (CRESTOR) 5 MG tablet Take 5 mg by mouth once a week. monday    [provider]  valsartan (DIOVAN) 40 MG tablet Take 40 mg by mouth daily.    [provider]  vitamin E 400 UNIT capsule Take 400 Units by mouth daily.    [provider]    Allergies: Morphine  and codeine    Review of Systems  Updated Vital Signs BP 136/87 (BP Location: Right Arm)   Pulse 66   Temp 97.8 F (36.6 C) (Oral)   Resp  14   SpO2 93%   Physical Exam Vitals and nursing note reviewed.  Constitutional:      General: He is not in acute distress.    Appearance: He is well-developed.  HENT:     Head: Normocephalic and atraumatic.     Right Ear: External ear normal.     Left Ear: External ear normal.     Nose: Nose normal.  Eyes:     Extraocular Movements: Extraocular movements intact.     Conjunctiva/sclera: Conjunctivae normal.     Pupils: Pupils are equal, round, and reactive to light.     Comments: Pupils 3 mm bilaterally  Cardiovascular:     Rate and Rhythm: Normal rate and regular rhythm.     Heart sounds: Normal heart sounds.  Pulmonary:     Effort: Pulmonary effort is normal. No respiratory distress.     Breath sounds: Normal breath sounds.  Musculoskeletal:     Cervical back:  Normal range of motion and neck supple.     Right lower leg: No edema.     Left lower leg: No edema.  Skin:    General: Skin is warm and dry.  Neurological:     Mental Status: He is alert. Mental status is at baseline.     Cranial Nerves: No cranial nerve deficit.     Motor: No weakness.     Comments: Mumbling incomprehensible words.  Sometimes this is baseline per family.  Has difficulty complying with full exam.  Is moving all 4 extremities equally.  Is tracking with his eyes and responding to verbal commands     (all labs ordered are listed, but only abnormal results are displayed) Labs Reviewed  COMPREHENSIVE METABOLIC PANEL WITH GFR  CBC WITH DIFFERENTIAL/PLATELET  URINALYSIS, ROUTINE W REFLEX MICROSCOPIC  AMMONIA  URINE DRUG SCREEN  ETHANOL  CBG MONITORING, ED  CBG MONITORING, ED    EKG: None  Radiology: No results found.  {Document cardiac monitor, telemetry assessment procedure when appropriate:32947} Procedures   Medications Ordered in the ED - No data to display  Clinical Course as of 05/23/24 1705  Wed May 23, 2024  1701 Called and [facility].  They will have the on-call nurse call back shortly [RP]    Clinical Course User Index [RP] Yolande Lamar BROCKS, MD   {Click here for ABCD2, HEART and other calculators REFRESH Note before signing:1}                              Medical Decision Making Amount and/or Complexity of Data Reviewed Labs: ordered. Radiology: ordered.   ***  {Document critical care time when appropriate  Document review of labs and clinical decision tools ie CHADS2VASC2, etc  Document your independent review of radiology images and any outside records  Document your discussion with family members, caretakers and with consultants  Document social determinants of health affecting pt's care  Document your decision making why or why not admission, treatments were needed:32947:::1}   Final diagnoses:  None    ED  Discharge Orders     None

## 2024-05-23 NOTE — ED Triage Notes (Signed)
 Brought by Ambulance, Long Term resident of [facility] (hospice house), However, pt report being full code.   Pt Became unresponsive today, requiring three rescue breaths today   Pt baseline confused, EMS reports history of aggression, last Ativan 1mg  Po 1014.   Currently Eyes closed in bed, respond to voice, unable to understand sounds. Last BM on route today.

## 2024-05-23 NOTE — ED Notes (Signed)
 ED Provider at bedside.

## 2024-05-23 NOTE — ED Notes (Signed)
 Patient transported to CT

## 2024-05-23 NOTE — H&P (Signed)
 History and Physical  Julian Dominguez FMW:[id number] DOB: 1940-08-15 DOA: 05/23/2024  Referring physician: Dr. Yolande, EDP  PCP: Rosamond Leta NOVAK, MD  Outpatient Specialists: Neurology, palliative care medicine. Patient coming from: Hospice home, not hospice patient.  Chief Complaint: Altered mental status, loss of consciousness.  HPI: Julian Dominguez is a 83 y.o. male with medical history significant for advanced dementia, hypertension, type 2 diabetes, frequent falls, who presents to the ER from hospice home (palliative care patient) due to worsening altered mental status in the setting of dementia.  Also had agitation at the facility for which he received p.o. Ativan 1 mg x 1.  Reportedly, for the past 3 days, he has been more combative.  The patient became unresponsive today at the facility (admitted on 05/19/2024).  Had 2 jerk movements then went unconscious.  He received rescue breaths, lasting approximately 5 minutes.  He was at the hospice facility.    In the ER, confused and agitated.  He received 1 dose of IM olanzapine and was placed on mittens.  Lab studies were notable for leukocytosis 12.1 K.  UA positive for pyuria.  The patient was started on Rocephin empirically in the ER for presumed UTI.  Admitted by [facility], hospitalist service.  ED Course: Temperature 98.5.  BP 135/93, pulse 88, respiratory rate 20, O2 saturation 95% on room air.  Review of Systems: Review of systems as noted in the HPI. All other systems reviewed and are negative.   Past Medical History:  Diagnosis Date   Arthritis    hands and back   Cataract    Diverticulosis    at 04-28-11 colon    GERD (gastroesophageal reflux disease)    History of heart murmur in childhood    History of kidney stones    Hyperlipidemia    takes red yeast rice    Hypertension    Tubular adenoma of colon 04/28/2011   Type 2 dm    Wears hearing aid in both ears 02/25/2021   Past Surgical History:  Procedure  Laterality Date   ANKLE FRACTURE SURGERY Right 1996   hardware   ANTERIOR LAT LUMBAR FUSION Left 02/13/2019   Procedure: Left side Lumbar two-three Lumbar three-four Lumbar four-five Anterolateral decompression, posterior pedicle screw fixation;  Surgeon: Colon Shove, MD;  Location: [facility] OR;  Service: Neurosurgery;  Laterality: Left;   APPLICATION OF ROBOTIC ASSISTANCE FOR SPINAL PROCEDURE N/A 02/13/2019   Procedure: APPLICATION OF ROBOTIC ASSISTANCE FOR SPINAL PROCEDURE;  Surgeon: Colon Shove, MD;  Location: [facility] OR;  Service: Neurosurgery;  Laterality: N/A;   CATARACT EXTRACTION, BILATERAL  2011   CHOLECYSTECTOMY  1997   COLONOSCOPY  2017   cyst removed from finger     yrs ago   EYE SURGERY     bilateral cataract surgery with lens implants    LUMBAR PERCUTANEOUS PEDICLE SCREW 3 LEVEL N/A 02/13/2019   Procedure: LUMBAR PERCUTANEOUS PEDICLE SCREW LUMBAR TWO TO LUMBAR FIVE;  Surgeon: Colon Shove, MD;  Location: [facility] OR;  Service: Neurosurgery;  Laterality: N/A;   NECK SURGERY  2018   POLYPECTOMY     REVERSE SHOULDER ARTHROPLASTY Left 06/26/2019   Procedure: REVERSE SHOULDER ARTHROPLASTY;  Surgeon: Melita Drivers, MD;  Location: [facility] ORS;  Service: Orthopedics;  Laterality: Left;     Social History:  reports that he quit smoking about 55 years ago. His smoking use included cigarettes. His smokeless tobacco use includes chew. He reports that he does not drink alcohol  and does not use drugs.  Allergies  Allergen Reactions   Morphine  And Codeine Nausea And Vomiting    Family History  Problem Relation Age of Onset   Colon cancer Father 33   Stomach cancer Maternal Grandfather 22   Stomach cancer Maternal Aunt    Colon cancer Maternal Uncle    Colon cancer Cousin    Colon cancer Cousin    Colon polyps Neg Hx    Rectal cancer Neg Hx    Esophageal cancer Neg Hx       Prior to Admission medications   Medication Sig Start Date End Date Taking? Authorizing Provider  Ascorbic  Acid (VITAMIN C) 1000 MG tablet Take 1,000 mg by mouth daily.      [provider]  aspirin  81 MG chewable tablet Chew 81 mg by mouth daily.    [provider]  diclofenac Sodium (VOLTAREN) 1 % GEL Apply topically as needed.    [provider]  ferrous sulfate 325 (65 FE) MG EC tablet Take 325 mg by mouth daily with breakfast.    [provider]  Garlic 1000 MG CAPS Take 2,000 mg by mouth daily.     [provider]  glimepiride  (AMARYL ) 2 MG tablet Take 2 mg by mouth 2 (two) times daily.    [provider]  glucosamine-chondroitin 500-400 MG tablet Take 2 tablets by mouth daily.    [provider]  ibuprofen (ADVIL) 200 MG tablet Take 400 mg by mouth every 6 (six) hours as needed for moderate pain.    [provider]  melatonin 3 MG TABS tablet Take 3 mg by mouth at bedtime.    [provider]  metFORMIN (GLUCOPHAGE) 1000 MG tablet Take 500 mg by mouth 2 (two) times daily with a meal. 05/20/24   [provider]  mirabegron ER (MYRBETRIQ) 25 MG TB24 tablet Take 25 mg by mouth daily.    [provider]  Multiple Vitamin (MULTIVITAMIN) tablet Take 1 tablet by mouth daily.      [provider]  naproxen sodium (ALEVE) 220 MG tablet Take 220 mg by mouth.    [provider]  Omega-3 Fatty Acids (FISH OIL) 1000 MG CPDR Take 1,200 mg by mouth daily after breakfast. 2 pills in am    [provider]  oxybutynin  (DITROPAN  XL) 10 MG 24 hr tablet Take 1 tablet (10 mg total) by mouth at bedtime. Patient not taking: Reported on 04/12/2023 08/19/22   Onita Duos, MD  risperiDONE (RISPERDAL) 1 MG tablet Take 1 mg by mouth 2 (two) times daily.    [provider]  rosuvastatin (CRESTOR) 5 MG tablet Take 5 mg by mouth once a week. monday    [provider]  valsartan (DIOVAN) 40 MG tablet Take 40 mg by mouth daily.    [provider]  vitamin E 400 UNIT capsule Take 400  Units by mouth daily.    [provider]    Physical Exam: BP (!) 135/93   Pulse 88   Temp 97.8 F (36.6 C) (Oral)   Resp 20   SpO2 93%   General: 83 y.o. year-old male well developed well nourished in no acute distress.  Alert and oriented x 1.  Agitated. Cardiovascular: Regular rate and rhythm with no rubs or gallops.  No thyromegaly or JVD noted.  Trace lower extremity edema bilaterally. Respiratory: Clear to auscultation with no wheezes or rales. Poor inspiratory effort. Abdomen: Soft nontender nondistended with normal bowel sounds x4 quadrants. Muskuloskeletal: No  cyanosis, clubbing or edema noted bilaterally Neuro: CN II-XII intact, strength, sensation, reflexes Skin: No ulcerative lesions noted or rashes Psychiatry: Judgement and insight appear altered.  Agitated.         Labs on Admission:  Basic Metabolic Panel: Recent Labs  Lab 05/23/24 1731  NA 142  K 4.9  CL 104  CO2 23  GLUCOSE 155*  BUN 30*  CREATININE 1.27*  CALCIUM 10.2   Liver Function Tests: Recent Labs  Lab 05/23/24 1731  AST 22  ALT 16  ALKPHOS 86  BILITOT 0.5  PROT 7.6  ALBUMIN 4.4   No results for input(s): LIPASE, AMYLASE in the last 168 hours. Recent Labs  Lab 05/23/24 1731  AMMONIA <13   CBC: Recent Labs  Lab 05/23/24 1731  WBC 12.1*  NEUTROABS 9.4*  HGB 13.0  HCT 39.9  MCV 91.9  PLT 229   Cardiac Enzymes: No results for input(s): CKTOTAL, CKMB, CKMBINDEX, TROPONINI in the last 168 hours.  BNP (last 3 results) No results for input(s): BNP in the last 8760 hours.  ProBNP (last 3 results) No results for input(s): PROBNP in the last 8760 hours.  CBG: Recent Labs  Lab 05/23/24 1720  GLUCAP 159*    Radiological Exams on Admission: CT Head Wo Contrast Result Date: 05/23/2024 EXAM: CT HEAD WITHOUT CONTRAST 05/23/2024 06:14:02 PM TECHNIQUE: CT of the head was performed without the administration of intravenous contrast. Automated exposure  control, iterative reconstruction, and/or weight based adjustment of the mA/kV was utilized to reduce the radiation dose to as low as reasonably achievable. COMPARISON: 02/06/2024 CLINICAL HISTORY: possible seizure FINDINGS: BRAIN AND VENTRICLES: No acute hemorrhage. No evidence of acute infarct. No hydrocephalus. No extra-axial collection. No mass effect or midline shift. There is atrophy and chronic small vessel disease throughout the deep white matter. 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 Prominent ORBITS: No acute abnormality. SINUSES: No acute abnormality. SOFT TISSUES AND SKULL: No acute soft tissue abnormality. No skull fracture. IMPRESSION: 1. No acute intracranial abnormality. 2. Atrophy and chronic small vessel disease throughout the deep white matter. 3. 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 Electronically signed by: Franky Crease MD 05/23/2024 06:34 PM EST RP Workstation: [id number]   DG Chest Port 1 View Result Date: 05/23/2024 CLINICAL DATA:  Seizure. EXAM: PORTABLE CHEST 1 VIEW COMPARISON:  Chest radiograph dated 08/08/2023. FINDINGS: Shallow inspiration. No focal consolidation, pleural effusion, or pneumothorax. Stable cardiac silhouette. No acute osseous pathology. Left shoulder arthroplasty. IMPRESSION: No active disease. Electronically Signed   By: Vanetta Chou M.D.   On: 05/23/2024 18:02    EKG: I independently viewed the EKG done and my findings are as followed: Sinus rhythm rate of 77.  Nonspecific ST-T changes.  QTc 432.  Assessment/Plan Present on Admission:  AMS (altered mental status)  Principal Problem:   AMS (altered mental status) Acute metabolic encephalopathy, suspect multifactorial, secondary to UTI, worsening dementia Continue to treat underlying conditions Reorient as needed Fall and aspiration precautions.  Presumptive UTI, POA WBC 12.4 K UA positive for pyuria Follow urine culture and peripheral blood  cultures x 2 Continue Rocephin started empirically  Syncope, unspecified Follow orthostatic vital signs Follow transthoracic echo PT/OT evaluation Fall precautions.  Type 2 diabetes with hyperglycemia Hemoglobin A1c 6.5 in 2020. Follow updated hemoglobin A1c. Low-dose short acting and long-acting insulin   Advanced dementia with agitation Started Seroquel 12.5 mg nightly One-to-one sitter Feeding assistance. Fall and aspiration precautions.  Generalized weakness PT OT evaluation Fall precautions.  CKD 3A BUN 30, creatinine 1.27 with GFR 57. Appears to be at his baseline renal function Avoid nephrotoxic agents, dehydration, and hypotension Gentle IV fluid hydration, LR at 50 cc/h x 1 day. Monitor urine output.   Time: 75 minutes.   DVT prophylaxis: Subcu Lovenox daily.  Code Status: DNR/DNI, per his wife via phone.  Family Communication: Updated the patient's wife, Ms. Nena Sharps, via phone on 05/23/2024.  Disposition Plan: Admitted to telemetry unit.  Consults called: None.  Admission status: Inpatient status.   Status is: Inpatient The patient requires at least 2 midnights for further evaluation and treatment of present condition.   Terry LOISE Hurst MD [facility] Pager [phone]  If 7PM-7AM, please contact night-coverage [email or web address] Password [facility]  05/23/2024, 10:28 PM

## 2024-05-24 ENCOUNTER — Inpatient Hospital Stay (HOSPITAL_COMMUNITY)

## 2024-05-24 DIAGNOSIS — R4182 Altered mental status, unspecified: Secondary | ICD-10-CM | POA: Diagnosis not present

## 2024-05-24 DIAGNOSIS — Z515 Encounter for palliative care: Secondary | ICD-10-CM

## 2024-05-24 DIAGNOSIS — F03C11 Unspecified dementia, severe, with agitation: Secondary | ICD-10-CM

## 2024-05-24 DIAGNOSIS — N390 Urinary tract infection, site not specified: Principal | ICD-10-CM

## 2024-05-24 DIAGNOSIS — R55 Syncope and collapse: Secondary | ICD-10-CM

## 2024-05-24 LAB — CBC
HCT: 37.6 % — ABNORMAL LOW (ref 39.0–52.0)
Hemoglobin: 12.5 g/dL — ABNORMAL LOW (ref 13.0–17.0)
MCH: 29.6 pg (ref 26.0–34.0)
MCHC: 33.2 g/dL (ref 30.0–36.0)
MCV: 88.9 fL (ref 80.0–100.0)
Platelets: 220 K/uL (ref 150–400)
RBC: 4.23 MIL/uL (ref 4.22–5.81)
RDW: 13.2 % (ref 11.5–15.5)
WBC: 8.4 K/uL (ref 4.0–10.5)
nRBC: 0 % (ref 0.0–0.2)

## 2024-05-24 LAB — PHOSPHORUS: Phosphorus: 3.5 mg/dL (ref 2.5–4.6)

## 2024-05-24 LAB — ECHOCARDIOGRAM COMPLETE
AR max vel: 2.64 cm2
AV Area VTI: 2.88 cm2
AV Area mean vel: 2.55 cm2
AV Mean grad: 5.9 mmHg
AV Peak grad: 12.1 mmHg
Ao pk vel: 1.74 m/s
Area-P 1/2: 2.56 cm2
Calc EF: 29.1 %
S' Lateral: 4.35 cm
Single Plane A2C EF: 14.8 %
Single Plane A4C EF: 63.1 %
Weight: 3040.58 [oz_av]

## 2024-05-24 LAB — BASIC METABOLIC PANEL WITH GFR
Anion gap: 13 (ref 5–15)
BUN: 28 mg/dL — ABNORMAL HIGH (ref 8–23)
CO2: 24 mmol/L (ref 22–32)
Calcium: 10.2 mg/dL (ref 8.9–10.3)
Chloride: 104 mmol/L (ref 98–111)
Creatinine, Ser: 1.1 mg/dL (ref 0.61–1.24)
GFR, Estimated: >60 mL/min (ref >60)
Glucose, Bld: 103 mg/dL — ABNORMAL HIGH (ref 70–99)
Potassium: 3.8 mmol/L (ref 3.5–5.1)
Sodium: 141 mmol/L (ref 135–145)

## 2024-05-24 LAB — GLUCOSE, CAPILLARY
Glucose-Capillary: 142 mg/dL — ABNORMAL HIGH (ref 70–99)
Glucose-Capillary: 125 mg/dL — ABNORMAL HIGH (ref 70–99)
Glucose-Capillary: 107 mg/dL — ABNORMAL HIGH (ref 70–99)
Glucose-Capillary: 178 mg/dL — ABNORMAL HIGH (ref 70–99)
Glucose-Capillary: 79 mg/dL (ref 70–99)

## 2024-05-24 LAB — HEMOGLOBIN A1C
Hgb A1c MFr Bld: 7.4 % — ABNORMAL HIGH (ref 4.8–5.6)
Mean Plasma Glucose: 165.68 mg/dL

## 2024-05-24 LAB — MAGNESIUM: Magnesium: 2.1 mg/dL (ref 1.7–2.4)

## 2024-05-24 MED ORDER — HALOPERIDOL LACTATE 5 MG/ML IJ SOLN
0.5000 mg | INTRAMUSCULAR | Status: DC | PRN
  Administered 2024-05-24: 0.5 mg via INTRAVENOUS
  Filled 2024-05-24: qty 1

## 2024-05-24 MED ORDER — LORAZEPAM 2 MG/ML PO CONC: 1.0000 mg | ORAL | Status: DC | PRN

## 2024-05-24 MED ORDER — LORAZEPAM 1 MG PO TABS: 1.0000 mg | ORAL_TABLET | ORAL | Status: DC | PRN

## 2024-05-24 MED ORDER — LORAZEPAM 2 MG/ML IJ SOLN
1.0000 mg | INTRAMUSCULAR | Status: DC | PRN
Start: 2024-05-24 — End: 2024-05-25
  Administered 2024-05-24 – 2024-05-25 (×3): 1 mg via INTRAVENOUS
  Filled 2024-05-24 (×3): qty 1

## 2024-05-24 MED ORDER — POLYVINYL ALCOHOL 1.4 % OP SOLN: 1.0000 [drp] | Freq: Four times a day (QID) | OPHTHALMIC | Status: DC | PRN

## 2024-05-24 MED ORDER — GLYCOPYRROLATE 0.2 MG/ML IJ SOLN: 0.2000 mg | INTRAMUSCULAR | Status: DC | PRN

## 2024-05-24 MED ORDER — OXYCODONE HCL 20 MG/ML PO CONC: 5.0000 mg | ORAL | Status: DC | PRN

## 2024-05-24 MED ORDER — OXYCODONE HCL 20 MG/ML PO CONC
5.0000 mg | Freq: Four times a day (QID) | ORAL | Status: DC
  Administered 2024-05-24: 5 mg via ORAL
  Filled 2024-05-24: qty 0.5

## 2024-05-24 MED ORDER — GLYCOPYRROLATE 0.2 MG/ML IJ SOLN
0.2000 mg | INTRAMUSCULAR | Status: DC | PRN
Start: 2024-05-24 — End: 2024-05-25

## 2024-05-24 MED ORDER — HALOPERIDOL 0.5 MG PO TABS: 0.5000 mg | ORAL_TABLET | ORAL | Status: DC | PRN

## 2024-05-24 MED ORDER — GLYCOPYRROLATE 1 MG PO TABS: 1.0000 mg | ORAL_TABLET | ORAL | Status: DC | PRN

## 2024-05-24 MED ORDER — HALOPERIDOL LACTATE 2 MG/ML PO CONC: 0.5000 mg | ORAL | Status: DC | PRN

## 2024-05-24 NOTE — Plan of Care (Signed)

## 2024-05-24 NOTE — Progress Notes (Signed)
 [facility] PROGRESS NOTE  Julian Dominguez (DOB: 1941/05/28) FMW:[id number] PCP: Rosamond Leta NOVAK, MD  Brief Narrative: Julian Dominguez is an 83 y.o. male with a history of advanced dementia, T2DM, HTN, falls who presented to the ED on 05/23/2024 from [facility] residential hospice due to recent increase in agitation/mental status changes from baseline as well as an episode of reported loss of consciousness for 5 minutes. In the ED he was given olanzapine and required mittens for agitation. Due to leukocytosis, pyuria, and otherwise unexplained mental status changes, ceftriaxone was initiated for presumptive UTI.   Subjective: Continues to be intermittently agitated and not redirectable. Sitter at bedside. Still with mittens.   Objective: BP 128/85   Pulse 71   Temp 97.9 F (36.6 C) (Oral)   Resp 20   Wt 86.2 kg   SpO2 91%   BMI 28.89 kg/m   Gen: Elderly frail male resting quietly Pulm: Clear, even and nonlabored  CV: RRR, no pitting edema GI: Soft, NT, ND, +BS Neuro: Rouses and doesn't follow commands.    Assessment & Plan: Acute metabolic encephalopathy on chronic dementia:   - Treat UTI, otherwise institute delirium precautions, use a sitter in an effort to minimize pharmacologic interventions. Ideally would abbreviate hospital stay as much as possible. Continue nightly quetiapine  - Plan to return to residential hospice once medically able to do so.    Presumptive UTI: WBC normalized, remains afebrile, unable to report urinary symptoms.  - Continue ceftriaxone empirically. No previous culture data to guide therapy.  - Continue monitoring urine culture and blood cultures.     Syncope, unspecified:  - Plan echocardiogram and telemetry monitoring, though this could reasonably be forgone with this hospice patient. Will discuss with family.     T2DM with hyperglycemia: HbA1c 7.4%.  - No change to current management planned.    CKD 3A: At baseline. Will minimize  lab draws and avoid nephrotoxins.    HTN:  - Note formal medication reconciliation has not been possible as of yet. Will follow up on this.   Bernardino NOVAK Come, MD [facility] [email or web address] 05/24/2024, 11:50 AM

## 2024-05-24 NOTE — Plan of Care (Signed)
 ?  Problem: Clinical Measurements: ?Goal: Ability to maintain clinical measurements within normal limits will improve ?Outcome: Progressing ?Goal: Will remain free from infection ?Outcome: Progressing ?Goal: Diagnostic test results will improve ?Outcome: Progressing ?  ?

## 2024-05-24 NOTE — Consult Note (Signed)
 Consultation Note Date: 05/24/2024   Patient Name: Julian Dominguez  DOB: 01-Mar-1941  MRN: [id number]  Age / Sex: 83 y.o., male  PCP: Rosamond Leta NOVAK, MD Referring Physician: Bryn Bernardino NOVAK, MD  Reason for Consultation:  goals of care  HPI/Patient Profile: 83 y.o. male  with past medical history of advanced dementia, recurrent UTIs, DM2, admitted on 05/23/2024 from [facility] inpatient hospice due to increasing agitation and brief period of loss of consciousness. He is being treated for UTI. Palliative medicine consulted for goals of care.    Primary Decision Maker NEXT OF KIN - spouse Hardie Veltre  Discussion: Chart reviewed including labs, progress notes, imaging from this and previous encounters.  BMET stable.  CBC yesterday with elevated WBC 12.1. UA positive for few bacteria, large leukocytes, culture pending. Blood cultures pending. CT head without acute finding. Chest xray negative.  On eval patient awake, unable to respond to me verbally. Sitter at bedside notes he has not eaten very much- a few bites with encouragement from his spouse. Appears well nourished.  I called [facility] to verify his hospice status as ED notes indicated patient was living at [facility] but NOT enrolled in hospice. Representative at [facility] confirmed that patient was under hospice there in a long term residential bed.  Spoke with spouse via phone.  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 He has been residing at [facility]. Her hope was that he would eventually return home with continued hospice care.  We discussed his progressing dementia. He has not walked since September.  Nena shares that she does not believe Geral's current quality of life is one that he would choose for himself.  If his status were to worsen she would not want any escalation of care.  Currently her  goals are to treat his UTI with antibiotics, and pursue aggressive symptom management for his comfort. I recommended utilizing ativan and haldol for agitation, and scheduling low dose pain medication for likely unexpressed pain in dementia patient. Nena is in agreement with this plan.  We discussed wishes moving forward should patient have recurrent infection or decline once discharged. Nena would not want him rehospitalized. Would only want him kept out of pain and have good symptom management and be allowed to have natural dying process- she is certain that is what he would choose for himself.     SUMMARY OF RECOMMENDATIONS -UTI- complete antibiotics -Advanced Dementia- treat symptoms aggressively, comfort measures only, no artificial feeding, no rehospitalization- hopefule for d/c back to [facility] -Symptom management meds:  - Oxycodone  solution 5mg  q6hr scheduled -  Oxycodone  solution 5mg  q2hr prn for any sign of discomfort or dyspnea - Lorazepam 1mg  IV or po for anxiety or agitation -Haldol 0.5mg  q4hr for agitation -Recommend using symptom medications rather than restraints  Code Status/Advance Care Planning:   Code Status: Do not attempt resuscitation (DNR) - Comfort care    Prognosis:   < 6 weeks  Discharge Planning: Hospice facility  Primary Diagnoses: Present on Admission:  AMS (altered mental status)   Review of Systems  Unable to perform ROS   Physical Exam Vitals and nursing note reviewed.  Constitutional:      Appearance: He is ill-appearing.  Cardiovascular:     Rate and Rhythm: Normal rate.  Pulmonary:     Effort: Pulmonary effort is normal.  Neurological:     Mental Status: He is alert. He is disoriented.     Vital Signs: BP 128/85   Pulse 71   Temp 97.9 F (36.6 C) (Oral)   Resp 20   Wt 86.2 kg   SpO2 91%   BMI 28.89 kg/m  Pain Scale: PAINAD       SpO2: SpO2: 91 % O2 Device:SpO2: 91 % O2 Flow Rate: .   IO: Intake/output summary:   Intake/Output Summary (Last 24 hours) at 05/24/2024 1412 Last data filed at 05/24/2024 1300 Gross per 24 hour  Intake 100 ml  Output --  Net 100 ml    LBM: Last BM Date : 05/23/24 Baseline Weight: Weight: 86.2 kg Most recent weight: Weight: 86.2 kg       Thank you for this consult. Palliative medicine will continue to follow and assist as needed.   Signed by: Cassondra Stain, AGNP-C Palliative Medicine  Time includes:   Preparing to see the patient (e.g., review of tests) Obtaining and/or reviewing separately obtained history Performing a medically necessary appropriate examination and/or evaluation Counseling and educating the patient/family/caregiver Ordering medications, tests, or procedures Referring and communicating with other health care professionals (when not reported separately) Documenting clinical information in the electronic or other health record Independently interpreting results (not reported separately) and communicating results to the patient/family/caregiver Care coordination (not reported separately) Clinical documentation   Please contact Palliative Medicine Team phone at [phone] for questions and concerns.  For individual provider: See Tracey

## 2024-05-24 NOTE — Progress Notes (Signed)
  Echocardiogram 2D Echocardiogram has been performed.  Julian Dominguez 05/24/2024, 1:38 PM

## 2024-05-24 NOTE — TOC Initial Note (Signed)
 Transition of Care [facility]) - Initial/Assessment Note    Patient Details  Name: Julian Dominguez MRN: [id number] Date of Birth: 03/19/1941  Transition of Care [facility]) CM/SW Contact:    Noreen KATHEE Cleotilde ISRAEL Phone Number: 05/24/2024, 8:48 AM  Clinical Narrative:                  A PT consult was placed for this patient. CSW called Dorina with [facility] regarding patient and she shared that patient is at the residential house and that wife is paying privately for him to stay. CSW spoke with patient wife . Wife Nena shared that patient lives at [facility] and the plans are for him to  return back once medically stable for DC. Spouse shared that patient is unable to do any of his ADL's , the staff or family assist with his needs. CSW asked MD to remove PT order due to patient being under Hospice care and will return. CSW will continue to follow.  Expected Discharge Plan: Hospice Medical Facility Barriers to Discharge: Continued Medical Work up   Patient Goals and CMS Choice Patient states their goals for this hospitalization and ongoing recovery are:: return back to [facility].gov Compare Post Acute Care list provided to:: Patient Represenative (must comment) (Spouse)        Expected Discharge Plan and Services     Post Acute Care Choice: Durable Medical Equipment, Residential Hospice Bed Living arrangements for the past 2 months: Hospice Facility                                      Prior Living Arrangements/Services Living arrangements for the past 2 months: Hospice Facility Lives with:: Facility Resident Patient language and need for interpreter reviewed:: Yes Do you feel safe going back to the place where you live?: Yes      Need for Family Participation in Patient Care: Yes (Comment) Care giver support system in place?: Yes (comment)   Criminal Activity/Legal Involvement Pertinent to Current Situation/Hospitalization: No - Comment as  needed  Activities of Daily Living   ADL Screening (condition at time of admission) Independently performs ADLs?: No Does the patient have a NEW difficulty with bathing/dressing/toileting/self-feeding that is expected to last >3 days?: Yes (Initiates electronic notice to provider for possible OT consult) Does the patient have a NEW difficulty with getting in/out of bed, walking, or climbing stairs that is expected to last >3 days?: Yes (Initiates electronic notice to provider for possible PT consult) Does the patient have a NEW difficulty with communication that is expected to last >3 days?: Yes (Initiates electronic notice to provider for possible SLP consult) Does the patient have difficulty concentrating, remembering, or making decisions?: Yes  Permission Sought/Granted      Share Information with NAME: Maejor Erven     Permission granted to share info w Relationship: Spouse     Emotional Assessment Appearance:: Appears stated age Attitude/Demeanor/Rapport: Unable to Assess Affect (typically observed): Unable to Assess   Alcohol  / Substance Use: Tobacco Use Psych Involvement: No (comment)  Admission diagnosis:  AMS (altered mental status) [R41.82] Patient Active Problem List   Diagnosis Date Noted   AMS (altered mental status) 05/23/2024   Moderate dementia without behavioral disturbance, psychotic disturbance, mood disturbance, or anxiety (HCC) 07/05/2022   Urinary incontinence 07/05/2022   History of colon cancer 03/04/2022   Normocytic anemia 03/04/2022   Parkinsonism (  HCC) 10/01/2021   Obstructive sleep apnea 10/01/2021   Memory loss 10/01/2021   Gait abnormality 10/01/2021   S/P reverse total shoulder arthroplasty, left 06/26/2019   Lumbar stenosis with neurogenic claudication 02/13/2019   Degenerative disorder of musculoskeletal system 01/05/2019   PCP:  Rosamond Leta NOVAK, MD Pharmacy:   [facility] [address], [place] - [address] [place]  [address] Phone: [phone] Fax: [phone]     Social Drivers of Health (SDOH) Social History: SDOH Screenings   Food Insecurity: Patient Unable To Answer (05/23/2024)  Housing: Patient Unable To Answer (05/23/2024)  Transportation Needs: Patient Unable To Answer (05/23/2024)  Utilities: Patient Unable To Answer (05/23/2024)  Financial Resource Strain: Low Risk  (08/17/2023)   Received from [facility]  Physical Activity: Inactive (08/17/2023)   [facility]  Social Connections: Patient Unable To Answer (05/23/2024)  Stress: No Stress Concern Present (08/17/2023)   Received from [facility]  Tobacco Use: High Risk (05/23/2024)  Health Literacy: High Risk (05/18/2024)   Received from [facility]   SDOH Interventions:     Readmission Risk Interventions    05/24/2024    8:46 AM  Readmission Risk Prevention Plan  Medication Screening Complete  Transportation Screening Complete

## 2024-05-25 ENCOUNTER — Other Ambulatory Visit: Payer: Self-pay

## 2024-05-25 DIAGNOSIS — R4182 Altered mental status, unspecified: Secondary | ICD-10-CM

## 2024-05-25 DIAGNOSIS — Z515 Encounter for palliative care: Secondary | ICD-10-CM

## 2024-05-25 LAB — GLUCOSE, CAPILLARY
Glucose-Capillary: 95 mg/dL (ref 70–99)
Glucose-Capillary: 107 mg/dL — ABNORMAL HIGH (ref 70–99)

## 2024-05-25 MED ORDER — OXYCODONE HCL 20 MG/ML PO CONC: ORAL | 0 refills | Status: AC

## 2024-05-25 MED ORDER — LORAZEPAM 2 MG/ML PO CONC: 1.0000 mg | ORAL | 0 refills | Status: AC | PRN

## 2024-05-25 MED ORDER — QUETIAPINE FUMARATE 25 MG PO TABS: 12.5000 mg | ORAL_TABLET | Freq: Every day | ORAL | 0 refills | Status: AC

## 2024-05-25 MED ORDER — ACETAMINOPHEN 500 MG PO TABS: 500.0000 mg | ORAL_TABLET | Freq: Four times a day (QID) | ORAL | Status: AC | PRN

## 2024-05-25 MED ORDER — CEPHALEXIN 500 MG PO CAPS: 500.0000 mg | ORAL_CAPSULE | Freq: Two times a day (BID) | ORAL | 0 refills | Status: AC

## 2024-05-25 MED ORDER — HYDROMORPHONE HCL 1 MG/ML IJ SOLN
0.5000 mg | INTRAMUSCULAR | Status: DC | PRN
  Administered 2024-05-25: 0.5 mg via INTRAVENOUS
  Filled 2024-05-25: qty 0.5

## 2024-05-25 NOTE — TOC Transition Note (Addendum)
 Transition of Care [facility]) - Discharge Note   Patient Details  Name: Julian Dominguez MRN: [id number] Date of Birth: September 30, 1940  Transition of Care [facility]) CM/SW Contact:  Noreen KATHEE Cleotilde ISRAEL Phone Number: 05/25/2024, 10:54 AM   Clinical Narrative:     Patient is DC back to [facility]. CSW spoke with Rosina, nurse at [facility] who shared that pt is good to return back and that a nurse is currently here at bedside. Rosina also stated that she would need patient DC summary and new DNR form. CSW updated nurse and provided nurse with number for report. [facility] has been contacted.   Final next level of care: Hospice Medical Facility Barriers to Discharge: Barriers Resolved   Patient Goals and CMS Choice Patient states their goals for this hospitalization and ongoing recovery are:: return back to [facility].gov Compare Post Acute Care list provided to:: Patient Represenative (must comment) Choice offered to / list presented to : Spouse      Discharge Placement              Patient chooses bed at:  [facility]) Patient to be transferred to facility by: EMS Name of family member notified: Spouse Patient and family notified of of transfer: 05/25/24  Discharge Plan and Services Additional resources added to the After Visit Summary for       Post Acute Care Choice: Durable Medical Equipment, Residential Hospice Bed                               Social Drivers of Health (SDOH) Interventions SDOH Screenings   Food Insecurity: Patient Unable To Answer (05/23/2024)  Housing: Patient Unable To Answer (05/23/2024)  Transportation Needs: Patient Unable To Answer (05/23/2024)  Utilities: Patient Unable To Answer (05/23/2024)  Financial Resource Strain: Low Risk  (08/17/2023)   Received from [facility]  Physical Activity: Inactive (08/17/2023)   Received from [facility]  Social Connections: Patient Unable To Answer (05/23/2024)  Stress: No Stress  Concern Present (08/17/2023)   Received from [facility]  Tobacco Use: High Risk (05/23/2024)  Health Literacy: High Risk (05/18/2024)   Received from [facility]     Readmission Risk Interventions    05/25/2024   10:53 AM 05/24/2024    8:46 AM  Readmission Risk Prevention Plan  Medication Screening Complete Complete  Transportation Screening Complete Complete

## 2024-05-25 NOTE — Care Management Important Message (Signed)
 Important Message  Patient Details  Name: Julian Dominguez MRN: [id number] Date of Birth: [birth year]   Important Message Given:  Yes - Medicare IM     Joniya Boberg L Justyn Langham 05/25/2024, 11:31 AM

## 2024-05-25 NOTE — Plan of Care (Signed)
  Problem: Education: Goal: Knowledge of General Education information will improve Description: Including pain rating scale, medication(s)/side effects and non-pharmacologic comfort measures Outcome: Not Progressing   Problem: Health Behavior/Discharge Planning: Goal: Ability to manage health-related needs will improve Outcome: Not Progressing   Problem: Clinical Measurements: Goal: Ability to maintain clinical measurements within normal limits will improve Outcome: Progressing Goal: Will remain free from infection Outcome: Progressing Goal: Diagnostic test results will improve Outcome: Progressing Goal: Respiratory complications will improve Outcome: Progressing Goal: Cardiovascular complication will be avoided Outcome: Progressing   Problem: Activity: Goal: Risk for activity intolerance will decrease Outcome: Not Progressing   Problem: Nutrition: Goal: Adequate nutrition will be maintained Outcome: Not Progressing   Problem: Coping: Goal: Level of anxiety will decrease Outcome: Not Progressing   Problem: Elimination: Goal: Will not experience complications related to bowel motility Outcome: Progressing Goal: Will not experience complications related to urinary retention Outcome: Progressing   Problem: Pain Managment: Goal: General experience of comfort will improve and/or be controlled Outcome: Progressing   Problem: Safety: Goal: Ability to remain free from injury will improve Outcome: Progressing   Problem: Skin Integrity: Goal: Risk for impaired skin integrity will decrease Outcome: Progressing   Problem: Education: Goal: Ability to describe self-care measures that may prevent or decrease complications (Diabetes Survival Skills Education) will improve Outcome: Not Progressing   Problem: Coping: Goal: Ability to adjust to condition or change in health will improve Outcome: Progressing   Problem: Fluid Volume: Goal: Ability to maintain a balanced intake  and output will improve Outcome: Progressing   Problem: Health Behavior/Discharge Planning: Goal: Ability to identify and utilize available resources and services will improve Outcome: Not Progressing Goal: Ability to manage health-related needs will improve Outcome: Not Progressing   Problem: Metabolic: Goal: Ability to maintain appropriate glucose levels will improve Outcome: Progressing   Problem: Nutritional: Goal: Maintenance of adequate nutrition will improve Outcome: Progressing Goal: Progress toward achieving an optimal weight will improve Outcome: Progressing   Problem: Skin Integrity: Goal: Risk for impaired skin integrity will decrease Outcome: Progressing   Problem: Tissue Perfusion: Goal: Adequacy of tissue perfusion will improve Outcome: Progressing   Problem: Education: Goal: Knowledge of the prescribed therapeutic regimen will improve Outcome: Not Progressing   Problem: Coping: Goal: Ability to identify and develop effective coping behavior will improve Outcome: Not Progressing   Problem: Respiratory: Goal: Verbalizations of increased ease of respirations will increase Outcome: Not Progressing   Problem: Pain Management: Goal: Satisfaction with pain management regimen will improve Outcome: Progressing

## 2024-05-25 NOTE — Discharge Summary (Signed)
 Physician Discharge Summary   Patient: Julian Dominguez MRN: [id number] DOB: June 26, 1941  Admit date:     05/23/2024  Discharge date: 05/25/24  Discharge Physician: Bernardino KATHEE Come   PCP: Rosamond Leta KATHEE, MD   Recommendations at discharge:  Continue antibiotics for presumptive UTI, otherwise full comfort measures at residential hospice. Do not return to hospital.   Discharge Diagnoses: Principal Problem:   AMS (altered mental status) Active Problems:   Hospice care patient  Hospital Course: Julian Dominguez is an 83 y.o. male with a history of advanced dementia, T2DM, HTN, falls who presented to the ED on 05/23/2024 from [facility] residential hospice due to recent increase in agitation/mental status changes from baseline as well as an episode of reported loss of consciousness for 5 minutes. In the ED he was given olanzapine and required mittens for agitation. Due to leukocytosis, pyuria, and otherwise unexplained mental status changes, ceftriaxone was initiated for presumptive UTI.   Assessment and Plan: Acute metabolic encephalopathy on chronic dementia:   - Treat UTI, otherwise started nightly quetiapine and prn's as discussed below - Plan to return to residential hospice, recommend ativan concentrated solution for anxiety/agitation and scheduled oxycodone  given his inability to report pain, as well as prn dosing. Physical restraints not recommended. Pt's family wishes for any symptoms to be managed at residential hospice.    Presumptive UTI: WBC normalized, remains afebrile, unable to report urinary symptoms.  - Continued ceftriaxone empirically. No previous culture data to guide therapy. Will plan keflex to complete the course per GOC discussions with family. - Continue monitoring urine culture and blood cultures.      CKD 3A: At baseline. Will minimize lab draws and avoid nephrotoxins.     HTN: No Tx in hospice pt  Consultants: Palliative care Procedures performed: None   Disposition: Return to residential hospice Diet recommendation: Per comfort DISCHARGE MEDICATION: Allergies as of 05/25/2024       Reactions   Morphine  And Codeine Nausea And Vomiting        Medication List     STOP taking these medications    aspirin  81 MG chewable tablet   diclofenac Sodium 1 % Gel Commonly known as: VOLTAREN   ferrous sulfate 325 (65 FE) MG EC tablet   Fish Oil 1000 MG Cpdr   Garlic 1000 MG Caps   glimepiride  2 MG tablet Commonly known as: AMARYL    glucosamine-chondroitin 500-400 MG tablet   ibuprofen 200 MG tablet Commonly known as: ADVIL   melatonin 3 MG Tabs tablet   metFORMIN 1000 MG tablet Commonly known as: GLUCOPHAGE   multivitamin tablet   Myrbetriq 25 MG Tb24 tablet Generic drug: mirabegron ER   naproxen sodium 220 MG tablet Commonly known as: ALEVE   oxybutynin  10 MG 24 hr tablet Commonly known as: Ditropan  XL   risperiDONE 1 MG tablet Commonly known as: RISPERDAL   rosuvastatin 5 MG tablet Commonly known as: CRESTOR   valsartan 40 MG tablet Commonly known as: DIOVAN   vitamin C 1000 MG tablet   vitamin E 180 MG (400 UNITS) capsule       TAKE these medications    acetaminophen  500 MG tablet Commonly known as: TYLENOL  Take 1 tablet (500 mg total) by mouth every 6 (six) hours as needed for mild pain (pain score 1-3), fever or headache.   cephALEXin 500 MG capsule Commonly known as: KEFLEX Take 1 capsule (500 mg total) by mouth 2 (two) times daily for 5 days.   LORazepam  2 MG/ML concentrated solution Commonly known as: ATIVAN Place 0.5 mLs (1 mg total) under the tongue every 4 (four) hours as needed for anxiety.   oxyCODONE  20 MG/ML concentrated solution Commonly known as: ROXICODONE  INTENSOL Give 5mg  po every 6 hours scheduled and additionally every 2 hours as needed for any evidence of discomfort   QUEtiapine 25 MG tablet Commonly known as: SEROQUEL Take 0.5 tablets (12.5 mg total) by mouth at  bedtime.        Discharge Exam: Filed Weights   05/23/24 2234  Weight: 86.2 kg  No distress, elderly and frail male  Condition at discharge: Stable for transfer back to hospice, guarded prognosis  The results of significant diagnostics from this hospitalization (including imaging, microbiology, ancillary and laboratory) are listed below for reference.   Imaging Studies: ECHOCARDIOGRAM COMPLETE Result Date: 05/24/2024    ECHOCARDIOGRAM REPORT   Patient Name:   Julian Dominguez [facility] Date of Exam: 05/24/2024 Medical Rec #:  [id number]            Height:       68.0 in Accession #:    [id number]           Weight:       190.0 lb Date of Birth:  [birth year]           BSA:          2.000 m Patient Age:    82 years             BP:           128/85 mmHg Patient Gender: M                    HR:           80 bpm. Exam Location:  [facility] Procedure: 2D Echo, Cardiac Doppler and Color Doppler (Both Spectral and Color            Flow Doppler were utilized during procedure). Indications:    R55 Syncope  History:        Patient has no prior history of Echocardiogram examinations.                 Signs/Symptoms:Alzheimer's and Altered Mental Status; Risk                 Factors:Sleep Apnea. UTI.  Sonographer:    Ellouise Mose RDCS Referring Phys: [id number] CAROLE N HALL  Sonographer Comments: Technically difficult study due to poor echo windows. Image acquisition challenging due to respiratory motion. Patient with dementia, could not turn or follow instructions to hold breath. IMPRESSIONS  1. Left ventricular ejection fraction, by estimation, is 60 to 65%. The left ventricle has normal function. Left ventricular endocardial border not optimally defined to evaluate regional wall motion. Left ventricular diastolic parameters are consistent with Grade I diastolic dysfunction (impaired relaxation).  2. Right ventricular systolic function is normal. The right ventricular size is normal. Tricuspid regurgitation signal is  inadequate for assessing PA pressure.  3. The mitral valve is normal in structure. No evidence of mitral valve regurgitation. No evidence of mitral stenosis.  4. The aortic valve is tricuspid. Aortic valve regurgitation is not visualized. Aortic valve sclerosis is present, with no evidence of aortic valve stenosis.  5. The inferior vena cava is normal in size with greater than 50% respiratory variability, suggesting right atrial pressure of 3 mmHg. FINDINGS  Left Ventricle: Left ventricular ejection fraction, by estimation, is 60 to 65%. The left  ventricle has normal function. Left ventricular endocardial border not optimally defined to evaluate regional wall motion. The left ventricular internal cavity size was normal in size. There is no left ventricular hypertrophy. Left ventricular diastolic parameters are consistent with Grade I diastolic dysfunction (impaired relaxation). Normal left ventricular filling pressure. Right Ventricle: The right ventricular size is normal. No increase in right ventricular wall thickness. Right ventricular systolic function is normal. Tricuspid regurgitation signal is inadequate for assessing PA pressure. Left Atrium: Left atrial size was normal in size. Right Atrium: Right atrial size was normal in size. Pericardium: There is no evidence of pericardial effusion. Mitral Valve: The mitral valve is normal in structure. No evidence of mitral valve regurgitation. No evidence of mitral valve stenosis. Tricuspid Valve: The tricuspid valve is normal in structure. Tricuspid valve regurgitation is trivial. No evidence of tricuspid stenosis. Aortic Valve: The aortic valve is tricuspid. Aortic valve regurgitation is not visualized. Aortic valve sclerosis is present, with no evidence of aortic valve stenosis. Aortic valve mean gradient measures 5.9 mmHg. Aortic valve peak gradient measures 12.1 mmHg. Aortic valve area, by VTI measures 2.88 cm. Pulmonic Valve: The pulmonic valve was not well  visualized. Pulmonic valve regurgitation is not visualized. No evidence of pulmonic stenosis. Aorta: The aortic root is normal in size and structure and the ascending aorta was not well visualized. Venous: The inferior vena cava is normal in size with greater than 50% respiratory variability, suggesting right atrial pressure of 3 mmHg. IAS/Shunts: No atrial level shunt detected by color flow Doppler.  LEFT VENTRICLE PLAX 2D LVIDd:         5.60 cm     Diastology LVIDs:         4.35 cm     LV e' medial:    7.94 cm/s LV PW:         1.20 cm     LV E/e' medial:  8.6 LV IVS:        1.00 cm     LV e' lateral:   9.25 cm/s LVOT diam:     2.20 cm     LV E/e' lateral: 7.4 LV SV:         96 LV SV Index:   48 LVOT Area:     3.80 cm  LV Volumes (MOD) LV vol d, MOD A2C: 33.9 ml LV vol d, MOD A4C: 84.1 ml LV vol s, MOD A2C: 28.9 ml LV vol s, MOD A4C: 31.0 ml LV SV MOD A2C:     5.0 ml LV SV MOD A4C:     84.1 ml LV SV MOD BP:      12.9 ml RIGHT VENTRICLE             IVC RV S prime:     12.10 cm/s  IVC diam: 1.90 cm TAPSE (M-mode): 2.0 cm LEFT ATRIUM             Index        RIGHT ATRIUM           Index LA diam:        4.30 cm 2.15 cm/m   RA Area:     13.60 cm LA Vol (A2C):   47.4 ml 23.70 ml/m  RA Volume:   28.80 ml  14.40 ml/m LA Vol (A4C):   34.9 ml 17.45 ml/m LA Biplane Vol: 41.8 ml 20.90 ml/m  AORTIC VALVE AV Area (Vmax):    2.64 cm AV Area (Vmean):   2.55 cm AV  Area (VTI):     2.88 cm AV Vmax:           174.17 cm/s AV Vmean:          113.657 cm/s AV VTI:            0.332 m AV Peak Grad:      12.1 mmHg AV Mean Grad:      5.9 mmHg LVOT Vmax:         121.00 cm/s LVOT Vmean:        76.200 cm/s LVOT VTI:          0.252 m LVOT/AV VTI ratio: 0.76  AORTA Ao Root diam: 3.10 cm Ao Asc diam:  3.40 cm MITRAL VALVE MV Area (PHT): 2.56 cm    SHUNTS MV Decel Time: 296 msec    Systemic VTI:  0.25 m MV E velocity: 68.10 cm/s  Systemic Diam: 2.20 cm MV A velocity: 88.00 cm/s MV E/A ratio:  0.77 Dorn Ross MD Electronically signed  by Dorn Ross MD Signature Date/Time: 05/24/2024/2:43:14 PM    Final    CT Head Wo Contrast Result Date: 05/23/2024 EXAM: CT HEAD WITHOUT CONTRAST 05/23/2024 06:14:02 PM TECHNIQUE: CT of the head was performed without the administration of intravenous contrast. Automated exposure control, iterative reconstruction, and/or weight based adjustment of the mA/kV was utilized to reduce the radiation dose to as low as reasonably achievable. COMPARISON: 02/06/2024 CLINICAL HISTORY: possible seizure FINDINGS: BRAIN AND VENTRICLES: No acute hemorrhage. No evidence of acute infarct. No hydrocephalus. No extra-axial collection. No mass effect or midline shift. There is atrophy and chronic small vessel disease throughout the deep white matter. 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 Prominent ORBITS: No acute abnormality. SINUSES: No acute abnormality. SOFT TISSUES AND SKULL: No acute soft tissue abnormality. No skull fracture. IMPRESSION: 1. No acute intracranial abnormality. 2. Atrophy and chronic small vessel disease throughout the deep white matter. 3. 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 Electronically signed by: Franky Crease MD 05/23/2024 06:34 PM EST RP Workstation: [id number]   DG Chest Port 1 View Result Date: 05/23/2024 CLINICAL DATA:  Seizure. EXAM: PORTABLE CHEST 1 VIEW COMPARISON:  Chest radiograph dated 08/08/2023. FINDINGS: Shallow inspiration. No focal consolidation, pleural effusion, or pneumothorax. Stable cardiac silhouette. No acute osseous pathology. Left shoulder arthroplasty. IMPRESSION: No active disease. Electronically Signed   By: Vanetta Chou M.D.   On: 05/23/2024 18:02    Microbiology: Results for orders placed or performed during the hospital encounter of 05/23/24  Urine Culture     Status: None (Preliminary result)   Collection Time: 05/23/24  6:28 PM   Specimen: Urine, Catheterized  Result Value Ref Range Status   Specimen  Description   Final    URINE, CATHETERIZED Performed at [facility], [address], [place], [place] [address]    Special Requests   Final    NONE Performed at [facility], [address], [place], [place] [address]    Culture   Final    CULTURE REINCUBATED FOR BETTER GROWTH Performed at [facility], [address], [place], [place] [address]    Report Status PENDING  Incomplete  Blood culture (routine x 2)     Status: None (Preliminary result)   Collection Time: 05/23/24  8:18 PM   Specimen: BLOOD  Result Value Ref Range Status   Specimen Description BLOOD BLOOD RIGHT ARM  Final   Special Requests   Final    AEROBIC BOTTLE ONLY Blood  Culture results may not be optimal due to an inadequate volume of blood received in culture bottles   Culture   Final    NO GROWTH 2 DAYS Performed at [facility], [address], [place], [place] [address]    Report Status PENDING  Incomplete  Blood culture (routine x 2)     Status: None (Preliminary result)   Collection Time: 05/23/24  8:18 PM   Specimen: BLOOD  Result Value Ref Range Status   Specimen Description BLOOD BLOOD RIGHT WRIST  Final   Special Requests AEROBIC BOTTLE ONLY Blood Culture adequate volume  Final   Culture   Final    NO GROWTH 2 DAYS Performed at [facility], [address], [place], [place] [address]    Report Status PENDING  Incomplete    Labs: CBC: Recent Labs  Lab 05/23/24 1731 05/24/24 0348  WBC 12.1* 8.4  NEUTROABS 9.4*  --   HGB 13.0 12.5*  HCT 39.9 37.6*  MCV 91.9 88.9  PLT 229 220   Basic Metabolic Panel: Recent Labs  Lab 05/23/24 1731 05/24/24 0348  NA 142 141  K 4.9 3.8  CL 104 104  CO2 23 24  GLUCOSE 155* 103*  BUN 30* 28*  CREATININE 1.27* 1.10  CALCIUM 10.2 10.2  MG  --  2.1  PHOS  --  3.5   Liver Function Tests: Recent Labs  Lab 05/23/24 1731  AST 22  ALT 16  ALKPHOS 86  BILITOT 0.5  PROT 7.6  ALBUMIN 4.4   CBG: Recent Labs  Lab 05/24/24 0751 05/24/24 1156  05/24/24 1722 05/24/24 2110 05/25/24 0745  GLUCAP 125* 107* 178* 79 95    Discharge time spent: greater than 30 minutes.  Signed: Bernardino KATHEE Come, MD [facility] 05/25/2024

## 2024-05-25 NOTE — Plan of Care (Signed)
  Problem: Education: Goal: Knowledge of General Education information will improve Description: Including pain rating scale, medication(s)/side effects and non-pharmacologic comfort measures Outcome: Not Progressing   Problem: Health Behavior/Discharge Planning: Goal: Ability to manage health-related needs will improve Outcome: Not Progressing   Problem: Activity: Goal: Risk for activity intolerance will decrease Outcome: Not Progressing   Problem: Nutrition: Goal: Adequate nutrition will be maintained Outcome: Not Progressing   Problem: Skin Integrity: Goal: Risk for impaired skin integrity will decrease Outcome: Not Progressing

## 2024-05-26 LAB — URINE CULTURE: Culture: >=100000 — AB

## 2024-05-28 DIAGNOSIS — G4700 Insomnia, unspecified: Secondary | ICD-10-CM | POA: Diagnosis not present

## 2024-05-28 DIAGNOSIS — E109 Type 1 diabetes mellitus without complications: Secondary | ICD-10-CM | POA: Diagnosis not present

## 2024-05-28 DIAGNOSIS — G311 Senile degeneration of brain, not elsewhere classified: Secondary | ICD-10-CM | POA: Diagnosis not present

## 2024-05-28 DIAGNOSIS — R451 Restlessness and agitation: Secondary | ICD-10-CM | POA: Diagnosis not present

## 2024-05-28 DIAGNOSIS — F419 Anxiety disorder, unspecified: Secondary | ICD-10-CM | POA: Diagnosis not present

## 2024-05-28 DIAGNOSIS — I10 Essential (primary) hypertension: Secondary | ICD-10-CM | POA: Diagnosis not present

## 2024-05-28 LAB — CULTURE, BLOOD (ROUTINE X 2)
Culture: NO GROWTH
Culture: NO GROWTH
Special Requests: ADEQUATE

## 2024-05-29 DIAGNOSIS — G4700 Insomnia, unspecified: Secondary | ICD-10-CM | POA: Diagnosis not present

## 2024-05-29 DIAGNOSIS — E109 Type 1 diabetes mellitus without complications: Secondary | ICD-10-CM | POA: Diagnosis not present

## 2024-05-29 DIAGNOSIS — R451 Restlessness and agitation: Secondary | ICD-10-CM | POA: Diagnosis not present

## 2024-05-29 DIAGNOSIS — F419 Anxiety disorder, unspecified: Secondary | ICD-10-CM | POA: Diagnosis not present

## 2024-05-29 DIAGNOSIS — G311 Senile degeneration of brain, not elsewhere classified: Secondary | ICD-10-CM | POA: Diagnosis not present

## 2024-05-29 DIAGNOSIS — I10 Essential (primary) hypertension: Secondary | ICD-10-CM | POA: Diagnosis not present

## 2024-06-02 DIAGNOSIS — F419 Anxiety disorder, unspecified: Secondary | ICD-10-CM | POA: Diagnosis not present

## 2024-06-02 DIAGNOSIS — I10 Essential (primary) hypertension: Secondary | ICD-10-CM | POA: Diagnosis not present

## 2024-06-02 DIAGNOSIS — R451 Restlessness and agitation: Secondary | ICD-10-CM | POA: Diagnosis not present

## 2024-06-02 DIAGNOSIS — G311 Senile degeneration of brain, not elsewhere classified: Secondary | ICD-10-CM | POA: Diagnosis not present

## 2024-06-02 DIAGNOSIS — G4700 Insomnia, unspecified: Secondary | ICD-10-CM | POA: Diagnosis not present

## 2024-06-02 DIAGNOSIS — E109 Type 1 diabetes mellitus without complications: Secondary | ICD-10-CM | POA: Diagnosis not present

## 2024-06-04 DIAGNOSIS — I10 Essential (primary) hypertension: Secondary | ICD-10-CM | POA: Diagnosis not present

## 2024-06-04 DIAGNOSIS — F419 Anxiety disorder, unspecified: Secondary | ICD-10-CM | POA: Diagnosis not present

## 2024-06-04 DIAGNOSIS — G4700 Insomnia, unspecified: Secondary | ICD-10-CM | POA: Diagnosis not present

## 2024-06-04 DIAGNOSIS — E109 Type 1 diabetes mellitus without complications: Secondary | ICD-10-CM | POA: Diagnosis not present

## 2024-06-04 DIAGNOSIS — G311 Senile degeneration of brain, not elsewhere classified: Secondary | ICD-10-CM | POA: Diagnosis not present

## 2024-06-04 DIAGNOSIS — R451 Restlessness and agitation: Secondary | ICD-10-CM | POA: Diagnosis not present

## 2024-06-18 DEATH — deceased
